# Patient Record
Sex: Male | Born: 1975 | Race: Black or African American | Hispanic: No | Marital: Married | Smoking: Former smoker
Health system: Southern US, Community
[De-identification: ages and names within clinical notes are randomized; demographics above are authoritative.]

## PROBLEM LIST (undated history)

## (undated) DIAGNOSIS — E119 Type 2 diabetes mellitus without complications: Secondary | ICD-10-CM

## (undated) DIAGNOSIS — K219 Gastro-esophageal reflux disease without esophagitis: Secondary | ICD-10-CM

## (undated) DIAGNOSIS — Z789 Other specified health status: Secondary | ICD-10-CM

## (undated) HISTORY — DX: Type 2 diabetes mellitus without complications: E11.9

## (undated) HISTORY — PX: NO PAST SURGERIES: SHX2092

---

## 2009-04-01 ENCOUNTER — Emergency Department (HOSPITAL_COMMUNITY): Admission: EM | Admit: 2009-04-01 | Discharge: 2009-04-01 | Payer: Self-pay | Admitting: Emergency Medicine

## 2011-10-27 ENCOUNTER — Other Ambulatory Visit: Payer: Self-pay

## 2011-10-27 ENCOUNTER — Encounter: Payer: Self-pay | Admitting: Family Medicine

## 2011-10-27 ENCOUNTER — Ambulatory Visit (INDEPENDENT_AMBULATORY_CARE_PROVIDER_SITE_OTHER): Payer: BC Managed Care – PPO | Admitting: Family Medicine

## 2011-10-27 ENCOUNTER — Encounter (HOSPITAL_COMMUNITY): Payer: Self-pay

## 2011-10-27 ENCOUNTER — Inpatient Hospital Stay (HOSPITAL_COMMUNITY)
Admission: EM | Admit: 2011-10-27 | Discharge: 2011-10-30 | DRG: 566 | Disposition: A | Discharge status: Home / Self Care | Payer: BC Managed Care – PPO | Attending: Internal Medicine | Admitting: Internal Medicine

## 2011-10-27 VITALS — BP 130/88 | HR 125 | Temp 97.5°F | Resp 22

## 2011-10-27 DIAGNOSIS — E1101 Type 2 diabetes mellitus with hyperosmolarity with coma: Secondary | ICD-10-CM

## 2011-10-27 DIAGNOSIS — K219 Gastro-esophageal reflux disease without esophagitis: Secondary | ICD-10-CM | POA: Diagnosis present

## 2011-10-27 DIAGNOSIS — E860 Dehydration: Secondary | ICD-10-CM | POA: Diagnosis present

## 2011-10-27 DIAGNOSIS — E870 Hyperosmolality and hypernatremia: Secondary | ICD-10-CM | POA: Diagnosis present

## 2011-10-27 DIAGNOSIS — R3581 Nocturnal polyuria: Secondary | ICD-10-CM

## 2011-10-27 DIAGNOSIS — E1100 Type 2 diabetes mellitus with hyperosmolarity without nonketotic hyperglycemic-hyperosmolar coma (NKHHC): Secondary | ICD-10-CM

## 2011-10-27 DIAGNOSIS — E875 Hyperkalemia: Secondary | ICD-10-CM | POA: Diagnosis present

## 2011-10-27 DIAGNOSIS — R351 Nocturia: Secondary | ICD-10-CM

## 2011-10-27 DIAGNOSIS — R5383 Other fatigue: Secondary | ICD-10-CM

## 2011-10-27 DIAGNOSIS — E118 Type 2 diabetes mellitus with unspecified complications: Secondary | ICD-10-CM | POA: Insufficient documentation

## 2011-10-27 DIAGNOSIS — N179 Acute kidney failure, unspecified: Secondary | ICD-10-CM | POA: Diagnosis present

## 2011-10-27 HISTORY — DX: Gastro-esophageal reflux disease without esophagitis: K21.9

## 2011-10-27 HISTORY — DX: Other specified health status: Z78.9

## 2011-10-27 LAB — GLUCOSE, CAPILLARY
Glucose-Capillary: 481 mg/dL — ABNORMAL HIGH (ref 70–99)
Glucose-Capillary: 434 mg/dL — ABNORMAL HIGH (ref 70–99)
Glucose-Capillary: 420 mg/dL — ABNORMAL HIGH (ref 70–99)
Glucose-Capillary: 304 mg/dL — ABNORMAL HIGH (ref 70–99)

## 2011-10-27 LAB — BLOOD GAS, VENOUS
Acid-base deficit: 1.7 mmol/L (ref 0.0–2.0)
Bicarbonate: 25.3 mEq/L — ABNORMAL HIGH (ref 20.0–24.0)
O2 Saturation: 97 %
Patient temperature: 98.6
TCO2: 21.3 mmol/L (ref 0–100)
pCO2, Ven: 52 mmHg — ABNORMAL HIGH (ref 45.0–50.0)
pH, Ven: 7.309 — ABNORMAL HIGH (ref 7.250–7.300)
pO2, Ven: 107 mmHg — ABNORMAL HIGH (ref 30.0–45.0)

## 2011-10-27 LAB — OSMOLALITY: Osmolality: 387 mOsm/kg — ABNORMAL HIGH (ref 275–300)

## 2011-10-27 LAB — POCT CBC
Granulocyte percent: 81.4 %G — AB (ref 37–80)
HCT, POC: 53.9 % — AB (ref 43.5–53.7)
Hemoglobin: 18.1 g/dL (ref 14.1–18.1)
Lymph, poc: 1.6 (ref 0.6–3.4)
MCH, POC: 29.7 pg (ref 27–31.2)
MCHC: 33.5 g/dL (ref 31.8–35.4)
MCV: 88.5 fL (ref 80–97)
MID (cbc): 0.7 (ref 0–0.9)
MPV: 15 fL (ref 0–99.8)
POC Granulocyte: 9.8 — AB (ref 2–6.9)
POC LYMPH PERCENT: 13.1 %L (ref 10–50)
POC MID %: 5.5 %M (ref 0–12)
Platelet Count, POC: 235 10*3/uL (ref 142–424)
RBC: 6.09 M/uL (ref 4.69–6.13)
RDW, POC: 14.6 %
WBC: 12 10*3/uL — AB (ref 4.6–10.2)

## 2011-10-27 LAB — HEPATIC FUNCTION PANEL
ALT: 42 U/L (ref 0–53)
Albumin: 4.1 g/dL (ref 3.5–5.2)
Alkaline Phosphatase: 93 U/L (ref 39–117)
Total Protein: 8.2 g/dL (ref 6.0–8.3)

## 2011-10-27 LAB — URINE MICROSCOPIC-ADD ON

## 2011-10-27 LAB — DIFFERENTIAL
Eosinophils Absolute: 0 10*3/uL (ref 0.0–0.7)
Lymphocytes Relative: 10 % — ABNORMAL LOW (ref 12–46)
Lymphs Abs: 1.3 10*3/uL (ref 0.7–4.0)
Monocytes Absolute: 0.9 10*3/uL (ref 0.1–1.0)
Neutrophils Relative %: 82 % — ABNORMAL HIGH (ref 43–77)

## 2011-10-27 LAB — TROPONIN I: Troponin I: <0.3 ng/mL (ref <0.30)

## 2011-10-27 LAB — URINALYSIS, ROUTINE W REFLEX MICROSCOPIC
Bilirubin Urine: NEGATIVE
Nitrite: NEGATIVE
Specific Gravity, Urine: 1.038 — ABNORMAL HIGH (ref 1.005–1.030)
pH: 5 (ref 5.0–8.0)

## 2011-10-27 LAB — BASIC METABOLIC PANEL
BUN: 54 mg/dL — ABNORMAL HIGH (ref 6–23)
CO2: 23 mEq/L (ref 19–32)
CO2: 24 mEq/L (ref 19–32)
Calcium: 12.1 mg/dL — ABNORMAL HIGH (ref 8.4–10.5)
Calcium: 9.8 mg/dL (ref 8.4–10.5)
Chloride: 101 mEq/L (ref 96–112)
Chloride: 118 mEq/L — ABNORMAL HIGH (ref 96–112)
Creatinine, Ser: 2.04 mg/dL — ABNORMAL HIGH (ref 0.50–1.35)
GFR calc Af Amer: 47 mL/min — ABNORMAL LOW (ref >90)
GFR calc non Af Amer: 41 mL/min — ABNORMAL LOW (ref >90)
Glucose, Bld: 1006 mg/dL (ref 70–99)
Potassium: 5.3 mEq/L — ABNORMAL HIGH (ref 3.5–5.1)
Sodium: 149 mEq/L — ABNORMAL HIGH (ref 135–145)
Sodium: 160 mEq/L — ABNORMAL HIGH (ref 135–145)

## 2011-10-27 LAB — POCT GLYCOSYLATED HEMOGLOBIN (HGB A1C): Hemoglobin A1C: 12.3

## 2011-10-27 LAB — GLUCOSE, POCT (MANUAL RESULT ENTRY)

## 2011-10-27 MED ORDER — ONDANSETRON HCL 4 MG PO TABS: 4.0000 mg | ORAL_TABLET | Freq: Four times a day (QID) | ORAL | Status: DC | PRN

## 2011-10-27 MED ORDER — SODIUM CHLORIDE 0.45 % IV SOLN: INTRAVENOUS | Status: DC

## 2011-10-27 MED ORDER — LIVING WELL WITH DIABETES BOOK
Freq: Once | Status: DC
  Filled 2011-10-27 (×2): qty 1

## 2011-10-27 MED ORDER — DEXTROSE-NACL 5-0.45 % IV SOLN: INTRAVENOUS | Status: DC

## 2011-10-27 MED ORDER — SODIUM CHLORIDE 0.9 % IV SOLN
INTRAVENOUS | Status: DC
  Administered 2011-10-27: 17:00:00 via INTRAVENOUS

## 2011-10-27 MED ORDER — INSULIN REGULAR BOLUS VIA INFUSION
0.0000 [IU] | Freq: Three times a day (TID) | INTRAVENOUS | Status: DC
  Filled 2011-10-27: qty 10

## 2011-10-27 MED ORDER — DEXTROSE 50 % IV SOLN: 25.0000 mL | INTRAVENOUS | Status: DC | PRN

## 2011-10-27 MED ORDER — POLYETHYLENE GLYCOL 3350 17 G PO PACK
17.0000 g | PACK | Freq: Every day | ORAL | Status: DC | PRN
  Filled 2011-10-27: qty 1

## 2011-10-27 MED ORDER — PANTOPRAZOLE SODIUM 40 MG PO TBEC
40.0000 mg | DELAYED_RELEASE_TABLET | Freq: Every day | ORAL | Status: DC
  Administered 2011-10-28 – 2011-10-30 (×3): 40 mg via ORAL
  Filled 2011-10-27 (×3): qty 1

## 2011-10-27 MED ORDER — ACETAMINOPHEN 325 MG PO TABS: 650.0000 mg | ORAL_TABLET | Freq: Four times a day (QID) | ORAL | Status: DC | PRN

## 2011-10-27 MED ORDER — ACETAMINOPHEN 650 MG RE SUPP: 650.0000 mg | Freq: Four times a day (QID) | RECTAL | Status: DC | PRN

## 2011-10-27 MED ORDER — INSULIN ASPART 100 UNIT/ML ~~LOC~~ SOLN
10.0000 [IU] | Freq: Once | SUBCUTANEOUS | Status: AC
  Administered 2011-10-27: 10 [IU] via INTRAVENOUS
  Filled 2011-10-27: qty 1

## 2011-10-27 MED ORDER — OXYCODONE HCL 5 MG PO TABS: 5.0000 mg | ORAL_TABLET | ORAL | Status: DC | PRN

## 2011-10-27 MED ORDER — DEXTROSE-NACL 5-0.45 % IV SOLN
INTRAVENOUS | Status: DC
  Administered 2011-10-28: 75 mL/h via INTRAVENOUS

## 2011-10-27 MED ORDER — MORPHINE SULFATE 2 MG/ML IJ SOLN: 1.0000 mg | INTRAMUSCULAR | Status: DC | PRN

## 2011-10-27 MED ORDER — SODIUM CHLORIDE 0.9 % IV BOLUS (SEPSIS)
1000.0000 mL | Freq: Once | INTRAVENOUS | Status: AC
  Administered 2011-10-27: 1000 mL via INTRAVENOUS

## 2011-10-27 MED ORDER — SODIUM CHLORIDE 0.45 % IV SOLN
INTRAVENOUS | Status: DC
  Administered 2011-10-27 – 2011-10-28 (×2): via INTRAVENOUS

## 2011-10-27 MED ORDER — SODIUM CHLORIDE 0.9 % IV SOLN
INTRAVENOUS | Status: DC
  Filled 2011-10-27: qty 1

## 2011-10-27 MED ORDER — SODIUM CHLORIDE 0.9 % IJ SOLN
3.0000 mL | Freq: Two times a day (BID) | INTRAMUSCULAR | Status: DC
  Administered 2011-10-27 – 2011-10-29 (×3): 3 mL via INTRAVENOUS

## 2011-10-27 MED ORDER — SODIUM CHLORIDE 0.9 % IV SOLN
INTRAVENOUS | Status: DC
  Administered 2011-10-28: 4.3 [IU]/h via INTRAVENOUS
  Filled 2011-10-27 (×2): qty 1

## 2011-10-27 MED ORDER — ENOXAPARIN SODIUM 40 MG/0.4ML ~~LOC~~ SOLN
40.0000 mg | SUBCUTANEOUS | Status: DC
  Administered 2011-10-27 – 2011-10-29 (×3): 40 mg via SUBCUTANEOUS
  Filled 2011-10-27 (×4): qty 0.4

## 2011-10-27 MED ORDER — SODIUM CHLORIDE 0.9 % IV SOLN: INTRAVENOUS | Status: DC

## 2011-10-27 MED ORDER — INSULIN REGULAR BOLUS VIA INFUSION: 0.0000 [IU] | Freq: Three times a day (TID) | INTRAVENOUS | Status: DC

## 2011-10-27 MED ORDER — ONDANSETRON HCL 4 MG/2ML IJ SOLN: 4.0000 mg | Freq: Four times a day (QID) | INTRAMUSCULAR | Status: DC | PRN

## 2011-10-27 NOTE — Progress Notes (Signed)
Inpatient Diabetes Program Recommendations  AACE/ADA: New Consensus Statement on Inpatient Glycemic Control (2009)  Target Ranges:  Prepandial:   less than 140 mg/dL      Peak postprandial:   less than 180 mg/dL (1-2 hours)      Critically ill patients:  140 - 180 mg/dL    Inpatient Diabetes Program Recommendations Insulin - IV drip/GlucoStabilizer: gtt until stable  HgbA1C: 12.3 Outpatient Referral: will need OP referral for education  Orders entered for bedside RN to begin basic DM education once patient is feeling better.  Ordered 'Living Well with Diabetes' patient education manual for RN to go over with patient.  RN to assist patient with DM videos 501-510 on patient education network.

## 2011-10-27 NOTE — ED Provider Notes (Signed)
Medical screening examination/treatment/procedure(s) were conducted as a shared visit with non-physician practitioner(s) and myself.  I personally evaluated the patient during the encounter.  35yM referred from urgent care for evaluation of hyperglycemia. Pt reports about 2w of general malaise, polyuria and polydipsia. New onset diabetes. No fever or chills. No cough or SOB. No v/d. No urinary complaints. Labs with hyperglycemic state, but no evidence of acidosis. Non focal neurological exam and no report of confusion. Dehydrated. Hypernatremic. ARF.  Plan insulin, IVF. Pt will require admission for further evaluation and treatment.  CRITICAL CARE Performed by: Raeford Razor   Total critical care time: 35 minutes.  Critical care time was exclusive of separately billable procedures and treating other patients.  Critical care was necessary to treat or prevent imminent or life-threatening deterioration.  Critical care was time spent personally by me on the following activities: development of treatment plan with patient and/or surrogate as well as nursing, discussions with consultants, evaluation of patient's response to treatment, examination of patient, obtaining history from patient or surrogate, ordering and performing treatments and interventions, ordering and review of laboratory studies, ordering and review of radiographic studies, pulse oximetry and re-evaluation of patient's condition.  Raeford Razor, MD 11/03/11 [phone]

## 2011-10-27 NOTE — ED Provider Notes (Signed)
History     CSN: [id number]  Arrival date & time 10/27/11  1043   First MD Initiated Contact with Patient 10/27/11 1104      Chief Complaint  Patient presents with  . Hyperglycemia  . new onset diabetes     (Consider location/radiation/quality/duration/timing/severity/associated sxs/prior treatment) HPI  36 year old male presenting to the ED from [facility] for new onset of hyperglycemia, with associated fatigue, polyurea and polydipsia for the past several weeks.    Patient states for the past 2 weeks he has been feeling more tired than usual. He noticed that he has decreased appetite, with increased thirst and increased urination. He denies any precipitating factor. Denies increased recently. She denies fever, headache, sore throat, chest pain, shortness of breath, abdominal pain, or burning urination. He does have family history of diabetes. He has never been diagnosed with diabetes. He denies of the chronic medical problems.  No past medical history on file.  No past surgical history on file.  No family history on file.  History  Substance Use Topics  . Smoking status: Former Smoker    Types: Cigarettes  . Smokeless tobacco: Not on file  . Alcohol Use: Not on file      Review of Systems  All other systems reviewed and are negative.    Allergies  Review of patient's allergies indicates not on file.  Home Medications  No current outpatient prescriptions on file.  BP 110/82  Pulse 119  Temp 97.7 F (36.5 C)  Resp 20  SpO2 100%  Physical Exam  Nursing note and vitals reviewed. Constitutional: He appears well-developed and well-nourished.       Mildly lethargic but nontoxic appearance  HENT:  Head: Atraumatic.       Oral mucosa dry.  Eyes: Conjunctivae and EOM are normal. Pupils are equal, round, and reactive to light. Right eye exhibits no discharge. Left eye exhibits no discharge.  Neck: Neck supple.  Cardiovascular:       Tachycardia with  normal rhythm. No murmur, rubs or gallops  Pulmonary/Chest: Effort normal. No respiratory distress. He has no wheezes. He has no rales. He exhibits no tenderness.  Abdominal: Soft. There is no tenderness. There is no rebound.  Musculoskeletal: He exhibits no tenderness.       Baseline ROM, no obvious new focal weakness  Neurological:       Mental status and motor strength appears baseline for patient and situation  Skin: Skin is warm and dry. No rash noted.  Psychiatric: He has a normal mood and affect.    ED Course  Procedures (including critical care time)  Labs Reviewed - No data to display No results found.   No diagnosis found.  Results for orders placed in visit on 10/27/11  POCT CBC      Component Value Range   WBC 12.0 (*) 4.6 - 10.2 (K/uL)   Lymph, poc 1.6  0.6 - 3.4    POC LYMPH PERCENT 13.1  10 - 50 (%L)   MID (cbc) 0.7  0 - 0.9    POC MID % 5.5  0 - 12 (%M)   POC Granulocyte 9.8 (*) 2 - 6.9    Granulocyte percent 81.4 (*) 37 - 80 (%G)   RBC 6.09  4.69 - 6.13 (M/uL)   Hemoglobin 18.1  14.1 - 18.1 (g/dL)   HCT, POC 56.3 (*) 87.5 - 53.7 (%)   MCV 88.5  80 - 97 (fL)   MCH, POC 29.7  27 -  31.2 (pg)   MCHC 33.5  31.8 - 35.4 (g/dL)   RDW, POC 16.1     Platelet Count, POC 235  142 - 424 (K/uL)   MPV 15.0  0 - 99.8 (fL)  GLUCOSE, POCT (MANUAL RESULT ENTRY)      Component Value Range   POC Glucose HHH over 444    POCT GLYCOSYLATED HEMOGLOBIN (HGB A1C)      Component Value Range   Hemoglobin A1C 12.3     No results found.  Results for orders placed during the hospital encounter of 10/27/11  BASIC METABOLIC PANEL      Component Value Range   Sodium 149 (*) 135 - 145 (mEq/L)   Potassium 5.3 (*) 3.5 - 5.1 (mEq/L)   Chloride 101  96 - 112 (mEq/L)   CO2 23  19 - 32 (mEq/L)   Glucose, Bld 1006 (*) 70 - 99 (mg/dL)   BUN 54 (*) 6 - 23 (mg/dL)   Creatinine, Ser 0.96 (*) 0.50 - 1.35 (mg/dL)   Calcium 04.5 (*) 8.4 - 10.5 (mg/dL)   GFR calc non Af Amer 41 (*) >90  (mL/min)   GFR calc Af Amer 47 (*) >90 (mL/min)  DIFFERENTIAL      Component Value Range   Neutro Abs PENDING  1.7 - 7.7 (K/uL)   Lymphs Abs PENDING  0.7 - 4.0 (K/uL)   Monocytes Absolute PENDING  0.1 - 1.0 (K/uL)   Eosinophils Absolute PENDING  0.0 - 0.7 (K/uL)   Basophils Absolute PENDING  0.0 - 0.1 (K/uL)   Neutrophils Relative 82 (*) 43 - 77 (%)   Lymphocytes Relative 10 (*) 12 - 46 (%)   Monocytes Relative 8  3 - 12 (%)   Eosinophils Relative 0  0 - 5 (%)   Basophils Relative 0  0 - 1 (%)   Smear Review MORPHOLOGY UNREMARKABLE    URINALYSIS, ROUTINE W REFLEX MICROSCOPIC      Component Value Range   Color, Urine YELLOW  YELLOW    APPearance CLEAR  CLEAR    Specific Gravity, Urine 1.038 (*) 1.005 - 1.030    pH 5.0  5.0 - 8.0    Glucose, UA >1000 (*) NEGATIVE (mg/dL)   Hgb urine dipstick LARGE (*) NEGATIVE    Bilirubin Urine NEGATIVE  NEGATIVE    Ketones, ur TRACE (*) NEGATIVE (mg/dL)   Protein, ur 30 (*) NEGATIVE (mg/dL)   Urobilinogen, UA 0.2  0.0 - 1.0 (mg/dL)   Nitrite NEGATIVE  NEGATIVE    Leukocytes, UA NEGATIVE  NEGATIVE   TROPONIN I      Component Value Range   Troponin I <0.30  <0.30 (ng/mL)  BLOOD GAS, VENOUS      Component Value Range   pH, Ven 7.309 (*) 7.250 - 7.300    pCO2, Ven 52.0 (*) 45.0 - 50.0 (mmHg)   pO2, Ven 107.0 (*) 30.0 - 45.0 (mmHg)   Bicarbonate 25.3 (*) 20.0 - 24.0 (mEq/L)   TCO2 21.3  0 - 100 (mmol/L)   Acid-base deficit 1.7  0.0 - 2.0 (mmol/L)   O2 Saturation 97.0     Patient temperature 98.6     Drawn by CB,RN     Sample type VENOUS    URINE MICROSCOPIC-ADD ON      Component Value Range   Squamous Epithelial / LPF RARE  RARE    WBC, UA 0-2  <3 (WBC/hpf)   RBC / HPF 3-6  <3 (RBC/hpf)   Bacteria, UA RARE  RARE    Casts HYALINE CASTS (*) NEGATIVE    No results found.     Date: 10/27/2011  Rate: 121  Rhythm: sinus tachycardia  QRS Axis: normal  Intervals: normal  ST/T Wave abnormalities: nonspecific ST changes  Conduction  Disutrbances:none  Narrative Interpretation:   Old EKG Reviewed: none available    MDM  Patient presents with symptoms suggestive of diabetes. His hemoglobin A1c of 12.3 and a CBG of greater than 400s prior to arrival. No obvious precipitating factor, but I anticipate that this has been undiagnosed chronic problem as reflected by his A1C  12:22 PM Patient's labs reflect evidence of dehydration and appears to be consistence with HONK hyperglycemia.  Will call for admission.   No prior hx of diabetes, presenting with 2 weeks of increase fatigue, polyurea, polydipsia.  No fever.  A1C 12.3.  CBG >1000.  Non acidosis. WBC 12, BUN 54, Cr 2.04.  Need admission for further evaluation and diabetic education.  Currently receiving IVF, will start glucose stabilizer unless recommend otherwise.    1:23 PM I have discussed with [facility] hospitalist, Dr. Gwenlyn Perking, who has agrees to admit the patient. Patient will be admitted to a telemetry bed, team 4, under Dr. Gwenlyn Perking.  He also requests for patient to be started on a glucose stabilizer and normal saline running at 150 cc per hour.  Fayrene Helper, PA-C 10/27/11 1324

## 2011-10-27 NOTE — ED Notes (Signed)
Dr. Gwenlyn Perking has come to see the pt and states he will be making adjustments to the admitting orders.  He states the pt is ok to go to the bed he has been assigned to as his CBG at 1300 was 481.

## 2011-10-27 NOTE — Progress Notes (Deleted)
CRITICAL VALUE ALERT  Critical value received:  Serum osmalality 387  Date of notification:  10/27/11   Time of notification:  1806  Critical value read back:yes  Nurse who received alert:  CCMyers  MD notified (1st page):  [place]  Time of first page:  1810  MD notified (2nd page):  Time of second page:  Responding MD:  Gwenlyn Perking  Time MD responded:  [phone]

## 2011-10-27 NOTE — Progress Notes (Signed)
IV placed R AC 20 gauge NACL

## 2011-10-27 NOTE — Progress Notes (Signed)
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  His mouth is dry and he feels like he is about to pass out.  He is brought back emergently as he could not even sign his name when he came in with his mother.  Mom states pt never has had any medical problems.  No surgery.  No allergies.  No known injuries or headache.  No fever, abd or chest pain.    O:  Pt somnolent, hyperventilating and barely responsive HEENT unremarkable except for dry mucous membranes Chest clear Heart rapid, regular Abdomen:  Soft, nontender without masses or HSM Extrem:  Unremarkable Neuro:  Barely responsive to voice, somnolent  Moving 4 extrem equally.  BS > 444  A:  Hyperosmolar, perhaps with DKA  P:  Stat IV and transport to ED

## 2011-10-27 NOTE — ED Notes (Signed)
[facility] hospitalist was called about admitting orders.  He will be coming to see the pt prior to the pt going upstairs.

## 2011-10-27 NOTE — ED Notes (Signed)
TO ED via [facility] from [facility] with new onset diabetes, hyperglycemia. Hx of two days of extreme fatigue and thirst. 2 weeks of nocturia. No pertinent medical hx.

## 2011-10-27 NOTE — H&P (Signed)
PCP:   No primary provider on file.   Chief Complaint:  Polyuria, polydipsia, generalized fatigue  HPI: 36 year old male without significant past medical history; came from [place] origin care for new onset of hyperglycemia, with associated generalized fatigue, polyuria and polydipsia for the past several weeks. She reports that for the last 2 weeks he has been feeling more tired than usual, and oriented he has decreased appetite, with increased thirst and increased urination. Patient denies any dysuria, chest pain, fever, chills, headache, sore throat or shortness of breath. Patient reports having some nausea/vomiting over the last 2 days.  Of note patient has history of first degree relative with diabetes, but denies ever being diagnosed with diabetes in the pastor having any medical problems.  Allergies:  No Known Allergies    Past Medical History  Diagnosis Date  . No pertinent past medical history   . GERD (gastroesophageal reflux disease)     Past Surgical History  Procedure Date  . No past surgeries     Prior to Admission medications   Medication Sig Start Date End Date Taking? Authorizing Provider  Diphenhydramine-APAP (TYLENOL COLD RELIEF NIGHTTIME) 25-500 MG/15ML LIQD Take 10 mLs by mouth every 6 (six) hours as needed. For cold symptoms.   Yes Historical Provider, MD  ranitidine (ZANTAC) 75 MG tablet Take 75 mg by mouth daily as needed. For heartburn.   Yes Historical Provider, MD    Social History:  reports that he quit smoking about 5 years ago. His smoking use included Cigarettes. He has never used smokeless tobacco. He reports that he does not drink alcohol or use illicit drugs.  Family History  Problem Relation Age of Onset  . Diabetes type II Mother   . Hypertension Mother     Review of Systems:  Negative otherwise except as mentioned on history of present illness.   Physical Exam: Blood pressure 109/68, pulse 111, temperature 97.6 F (36.4 C),  temperature source Oral, resp. rate 18, SpO2 96.00%. Constitutional: He appears well-developed and well-nourished. In NAD  HENT:  Head: Atraumatic. Oral mucosa dry. Eyes: Conjunctivae and EOM are normal. Pupils are equal, round, and reactive to light. Right eye exhibits no discharge. Left eye exhibits no discharge.  Neck: Neck supple.  Cardiovascular: Tachycardia with normal rhythm. No murmur, rubs or gallops  Pulmonary/Chest: Effort normal. No respiratory distress. He has no wheezes. He has no rales. He exhibits no tenderness.  Abdominal: Soft. There is no tenderness. There is no rebound.  Musculoskeletal: He exhibits no tenderness. Normal ROM, no obvious new focal weakness  Neurological:AAOX; cranial nerve 2-12 grossly intact, muscle strength 5 out of 5 bilaterally symmetrically, normal finger to nose and no focal neurologic deficit.  Skin: Skin is warm and dry. No rash noted.  Psychiatric: He has a normal mood and affect.   Labs on Admission:  Results for orders placed during the hospital encounter of 10/27/11 (from the past 48 hour(s))  BASIC METABOLIC PANEL     Status: Abnormal   Collection Time   10/27/11 11:00 AM      Component Value Range Comment   Sodium 149 (*) 135 - 145 (mEq/L) REPEATED TO VERIFY   Potassium 5.3 (*) 3.5 - 5.1 (mEq/L) SLIGHT HEMOLYSIS   Chloride 101  96 - 112 (mEq/L) REPEATED TO VERIFY   CO2 23  19 - 32 (mEq/L) REPEATED TO VERIFY   Glucose, Bld 1006 (*) 70 - 99 (mg/dL)    BUN 54 (*) 6 - 23 (mg/dL)  Creatinine, Ser 2.04 (*) 0.50 - 1.35 (mg/dL)    Calcium 16.1 (*) 8.4 - 10.5 (mg/dL)    GFR calc non Af Amer 41 (*) >90 (mL/min)    GFR calc Af Amer 47 (*) >90 (mL/min)   DIFFERENTIAL     Status: Abnormal   Collection Time   10/27/11 11:00 AM      Component Value Range Comment   Neutro Abs 10.3 (*) 1.7 - 7.7 (K/uL)    Lymphs Abs 1.3  0.7 - 4.0 (K/uL)    Monocytes Absolute 0.9  0.1 - 1.0 (K/uL)    Eosinophils Absolute 0.0  0.0 - 0.7 (K/uL)    Basophils Absolute  0.0  0.0 - 0.1 (K/uL)    Neutrophils Relative 82 (*) 43 - 77 (%)    Lymphocytes Relative 10 (*) 12 - 46 (%)    Monocytes Relative 8  3 - 12 (%)    Eosinophils Relative 0  0 - 5 (%)    Basophils Relative 0  0 - 1 (%)    Smear Review MORPHOLOGY UNREMARKABLE     URINALYSIS, ROUTINE W REFLEX MICROSCOPIC     Status: Abnormal   Collection Time   10/27/11 11:36 AM      Component Value Range Comment   Color, Urine YELLOW  YELLOW     APPearance CLEAR  CLEAR     Specific Gravity, Urine 1.038 (*) 1.005 - 1.030     pH 5.0  5.0 - 8.0     Glucose, UA >1000 (*) NEGATIVE (mg/dL)    Hgb urine dipstick LARGE (*) NEGATIVE     Bilirubin Urine NEGATIVE  NEGATIVE     Ketones, ur TRACE (*) NEGATIVE (mg/dL)    Protein, ur 30 (*) NEGATIVE (mg/dL)    Urobilinogen, UA 0.2  0.0 - 1.0 (mg/dL)    Nitrite NEGATIVE  NEGATIVE     Leukocytes, UA NEGATIVE  NEGATIVE    URINE MICROSCOPIC-ADD ON     Status: Abnormal   Collection Time   10/27/11 11:36 AM      Component Value Range Comment   Squamous Epithelial / LPF RARE  RARE     WBC, UA 0-2  <3 (WBC/hpf)    RBC / HPF 3-6  <3 (RBC/hpf)    Bacteria, UA RARE  RARE     Casts HYALINE CASTS (*) NEGATIVE    BLOOD GAS, VENOUS     Status: Abnormal   Collection Time   10/27/11 11:50 AM      Component Value Range Comment   pH, Ven 7.309 (*) 7.250 - 7.300     pCO2, Ven 52.0 (*) 45.0 - 50.0 (mmHg)    pO2, Ven 107.0 (*) 30.0 - 45.0 (mmHg)    Bicarbonate 25.3 (*) 20.0 - 24.0 (mEq/L)    TCO2 21.3  0 - 100 (mmol/L)    Acid-base deficit 1.7  0.0 - 2.0 (mmol/L)    O2 Saturation 97.0      Patient temperature 98.6      Drawn by CB,RN      Sample type VENOUS     TROPONIN I     Status: Normal   Collection Time   10/27/11 12:30 PM      Component Value Range Comment   Troponin I <0.30  <0.30 (ng/mL)   GLUCOSE, CAPILLARY     Status: Abnormal   Collection Time   10/27/11  3:04 PM      Component Value Range Comment   Glucose-Capillary 481 (*)  70 - 99 (mg/dL)     Radiological Exams  on Admission: No results found.   Assessment/Plan 1-Diabetes mellitus with hyperosmolarity: Newly diagnosed diabetes with hyperosmolar state. Hemoglobin A1c 12.1. Will start the patient on glucose nebulizer, IV fluids, provide diabetes education and 1 stable determine will be his new regimen. Patient placed on clear liquid diet with plans of changing to modify carbohydrates.  2-Acute renal failure: Secondary to problem #1. We'll provide IV fluids and follow creatinine trend.  3-Hyperkalemia: Most likely has a sitter with hyperosmolar state; will provide fluid resuscitation and IV insulin. Patient has been admitted to telemetry.  4-Hypernatremia: secondary to dehydration most likely, will provide IV fluids and follow electrolytes trend.  5-Dehydration: Will provide IV fluids. Allow for clear liquids.   6-GERD (gastroesophageal reflux disease): Stop Protonix.  7-DVT: Lovenox.    Time Spent on Admission: 50 minutes  Sheryl Towell [facility] [phone]  10/27/2011, 4:21 PM

## 2011-10-27 NOTE — Progress Notes (Signed)
CRITICAL VALUE ALERT  Critical value received: 387  Date of notification:  10/27/2011  Time of notification:  1806  Critical value read back:yes  Nurse who received alert:  CCMyers  MD notified (1st page):  [place]  Time of first page:  1819  MD notified (2nd page):NA  Time of second page:  Responding MD:  Gwenlyn Perking  Time MD responded:  [phone]

## 2011-10-27 NOTE — ED Notes (Signed)
Admitting MD at bedside.

## 2011-10-27 NOTE — ED Notes (Signed)
ZOX:[id number]<AV> Expected date:<BR> Expected time:10:35 AM<BR> Means of arrival:<BR> Comments:<BR> M12 - 35yoM New onset diabetes, CBG HI

## 2011-10-28 LAB — GLUCOSE, CAPILLARY
Glucose-Capillary: 129 mg/dL — ABNORMAL HIGH (ref 70–99)
Glucose-Capillary: 157 mg/dL — ABNORMAL HIGH (ref 70–99)
Glucose-Capillary: 162 mg/dL — ABNORMAL HIGH (ref 70–99)
Glucose-Capillary: 167 mg/dL — ABNORMAL HIGH (ref 70–99)
Glucose-Capillary: 216 mg/dL — ABNORMAL HIGH (ref 70–99)
Glucose-Capillary: 227 mg/dL — ABNORMAL HIGH (ref 70–99)
Glucose-Capillary: 313 mg/dL — ABNORMAL HIGH (ref 70–99)
Glucose-Capillary: 329 mg/dL — ABNORMAL HIGH (ref 70–99)

## 2011-10-28 LAB — CBC
HCT: 50.6 % (ref 39.0–52.0)
Hemoglobin: 17.4 g/dL — ABNORMAL HIGH (ref 13.0–17.0)
MCV: 87.7 fL (ref 78.0–100.0)
RDW: 12.9 % (ref 11.5–15.5)
WBC: 10.9 10*3/uL — ABNORMAL HIGH (ref 4.0–10.5)

## 2011-10-28 LAB — BASIC METABOLIC PANEL
CO2: 26 mEq/L (ref 19–32)
Chloride: 123 mEq/L — ABNORMAL HIGH (ref 96–112)
Creatinine, Ser: 1.25 mg/dL (ref 0.50–1.35)
Potassium: 4.2 mEq/L (ref 3.5–5.1)

## 2011-10-28 LAB — TSH: TSH: 1.216 u[IU]/mL (ref 0.350–4.500)

## 2011-10-28 LAB — SODIUM, URINE, RANDOM: Sodium, Ur: 25 mEq/L

## 2011-10-28 MED ORDER — INSULIN ASPART 100 UNIT/ML ~~LOC~~ SOLN
0.0000 [IU] | SUBCUTANEOUS | Status: DC
  Administered 2011-10-28: 2 [IU] via SUBCUTANEOUS
  Administered 2011-10-28: 7 [IU] via SUBCUTANEOUS
  Filled 2011-10-28: qty 3

## 2011-10-28 MED ORDER — INSULIN GLARGINE 100 UNIT/ML ~~LOC~~ SOLN: 15.0000 [IU] | SUBCUTANEOUS | Status: DC

## 2011-10-28 MED ORDER — INSULIN GLARGINE 100 UNIT/ML ~~LOC~~ SOLN
15.0000 [IU] | Freq: Every day | SUBCUTANEOUS | Status: DC
  Administered 2011-10-28 – 2011-10-29 (×2): 15 [IU] via SUBCUTANEOUS

## 2011-10-28 MED ORDER — INSULIN GLARGINE 100 UNIT/ML ~~LOC~~ SOLN: 15.0000 [IU] | Freq: Every day | SUBCUTANEOUS | Status: DC

## 2011-10-28 MED ORDER — INSULIN ASPART 100 UNIT/ML ~~LOC~~ SOLN
0.0000 [IU] | Freq: Every day | SUBCUTANEOUS | Status: DC
  Administered 2011-10-28: 4 [IU] via SUBCUTANEOUS
  Administered 2011-10-29: 2 [IU] via SUBCUTANEOUS

## 2011-10-28 MED ORDER — INSULIN GLARGINE 100 UNIT/ML ~~LOC~~ SOLN
10.0000 [IU] | Freq: Every day | SUBCUTANEOUS | Status: DC
  Administered 2011-10-28: 10 [IU] via SUBCUTANEOUS
  Filled 2011-10-28: qty 3

## 2011-10-28 MED ORDER — INSULIN ASPART 100 UNIT/ML ~~LOC~~ SOLN
0.0000 [IU] | Freq: Three times a day (TID) | SUBCUTANEOUS | Status: DC
  Administered 2011-10-28: 5 [IU] via SUBCUTANEOUS
  Administered 2011-10-29 (×2): 8 [IU] via SUBCUTANEOUS
  Administered 2011-10-29 – 2011-10-30 (×2): 5 [IU] via SUBCUTANEOUS
  Administered 2011-10-30: 8 [IU] via SUBCUTANEOUS

## 2011-10-28 NOTE — Progress Notes (Signed)
Subjective: Feeling a lot better, no chest pain, no shortness of breath. Reports he improvement in his appetite.  Objective: Vital signs in last 24 hours: Temp:  [97.4 F (36.3 C)-98.2 F (36.8 C)] 97.4 F (36.3 C) (02/02 1400) Pulse Rate:  [97-105] 97  (02/02 1400) Resp:  [18-20] 18  (02/02 1400) BP: (123-137)/(86-92) 134/86 mmHg (02/02 1400) SpO2:  [95 %-97 %] 97 % (02/02 1400) Weight change:  Last BM Date: 10/25/11  Intake/Output from previous day: 02/01 0701 - 02/02 0700 In: 1200.1 [I.V.:1200.1] Out: 550 [Urine:550]     Physical Exam: General: Alert, awake, oriented x3, in no acute distress. HEENT: No bruits, no goiter. Heart: Regular rate and rhythm, without murmurs, rubs, gallops. Lungs: Clear to auscultation bilaterally. Abdomen: Soft, nontender, nondistended, positive bowel sounds. Extremities: No clubbing cyanosis or edema with positive pedal pulses. Neuro: Grossly intact, nonfocal.  Lab Results: Basic Metabolic Panel:  Basename 10/28/11 0615 10/27/11 1700 10/27/11 1530  NA 160* -- 160*  K 4.2 -- 3.8  CL 123* -- 118*  CO2 26 -- 24  GLUCOSE 174* -- 487*  BUN 28* -- 43*  CREATININE 1.25 -- 1.61*  CALCIUM 9.7 -- 9.8  MG -- 3.3* --  PHOS -- 4.0 --   Liver Function Tests:  Basename 10/27/11 1700  AST 26  ALT 42  ALKPHOS 93  BILITOT 0.4  PROT 8.2  ALBUMIN 4.1   CBC:  Basename 10/28/11 0615 10/27/11 1100 10/27/11 1014  WBC 10.9* -- 12.0*  NEUTROABS -- 10.3* --  HGB 17.4* -- 18.1  HCT 50.6 -- 53.9*  MCV 87.7 -- 88.5  PLT 209 -- --   Cardiac Enzymes:  Basename 10/27/11 1230  CKTOTAL --  CKMB --  CKMBINDEX --  TROPONINI <0.30   CBG:  Basename 10/28/11 1354 10/28/11 1216 10/28/11 0817 10/28/11 0711 10/28/11 0623 10/28/11 0516  GLUCAP 313* 309* 188* 179* 167* 157*   Hemoglobin A1C:  Basename 10/27/11 1016  HGBA1C 12.3   Thyroid Function Tests:  Basename 10/27/11 1715  TSH 1.216  T4TOTAL --  FREET4 --  T3FREE --  THYROIDAB --    Urinalysis:  Basename 10/27/11 1136  COLORURINE YELLOW  LABSPEC 1.038*  PHURINE 5.0  GLUCOSEU >1000*  HGBUR LARGE*  BILIRUBINUR NEGATIVE  KETONESUR TRACE*  PROTEINUR 30*  UROBILINOGEN 0.2  NITRITE NEGATIVE  LEUKOCYTESUR NEGATIVE   Studies/Results: No results found.  Medications: Scheduled Meds:   . enoxaparin  40 mg Subcutaneous Q24H  . insulin aspart  0-15 Units Subcutaneous TID WC  . insulin aspart  0-5 Units Subcutaneous QHS  . insulin glargine  15 Units Subcutaneous QHS  . insulin glargine  15 Units Subcutaneous NOW  . living well with diabetes book   Does not apply Once  . pantoprazole  40 mg Oral Q1200  . sodium chloride  3 mL Intravenous Q12H  . DISCONTD: insulin aspart  0-9 Units Subcutaneous Q4H  . DISCONTD: insulin glargine  10 Units Subcutaneous QHS  . DISCONTD: insulin glargine  15 Units Subcutaneous QHS  . DISCONTD: insulin regular  0-10 Units Intravenous TID WC   Continuous Infusions:   . sodium chloride 100 mL/hr at 10/28/11 0828  . DISCONTD: sodium chloride    . DISCONTD: sodium chloride 150 mL/hr at 10/27/11 1726  . DISCONTD: dextrose 5 % and 0.45% NaCl 75 mL/hr at 10/28/11 0700  . DISCONTD: insulin (NOVOLIN-R) infusion 4.8 Units/hr (10/28/11 0722)   PRN Meds:.acetaminophen, acetaminophen, dextrose, morphine, ondansetron (ZOFRAN) IV, ondansetron, oxyCODONE, polyethylene glycol  Assessment/Plan:  1-Diabetes mellitus with hyperosmolarity: Hyperosmolar state resolve at this moment. Continue sliding scale insulin and also low and acting insulin to control patient's diabetes. Will continue IV fluids at 75 cc per hour and advance his diet to a modified carbohydrates.  2-Acute renal failure: Secondary to severe dehydration and volume contraction due to  hyperosmolality state, at this point resolve and back to within normal limits. Will continue monitoring kidney function.  3-Hyperkalemia: Resolved.  4-Hypernatremia: Stable and is slowly improving, will  continue saline 0.45%.  5-Dehydration: Resolved after IV fluid resuscitation.  6-GERD (gastroesophageal reflux disease): Continue PPI.  7-DVT: Continue Lovenox.    LOS: 1 day   Lexxie Winberg [facility] [phone]  10/28/2011, 4:46 PM

## 2011-10-29 LAB — GLUCOSE, CAPILLARY: Glucose-Capillary: 289 mg/dL — ABNORMAL HIGH (ref 70–99)

## 2011-10-29 LAB — CBC
HCT: 45.3 % (ref 39.0–52.0)
Hemoglobin: 15.4 g/dL (ref 13.0–17.0)
MCH: 30 pg (ref 26.0–34.0)
MCHC: 34 g/dL (ref 30.0–36.0)
MCV: 88.1 fL (ref 78.0–100.0)
Platelets: 161 10*3/uL (ref 150–400)
RBC: 5.14 MIL/uL (ref 4.22–5.81)
WBC: 7.5 10*3/uL (ref 4.0–10.5)

## 2011-10-29 LAB — BASIC METABOLIC PANEL
CO2: 23 mEq/L (ref 19–32)
Chloride: 108 mEq/L (ref 96–112)
GFR calc Af Amer: >90 mL/min (ref >90)
Potassium: 4.2 mEq/L (ref 3.5–5.1)
Sodium: 145 mEq/L (ref 135–145)

## 2011-10-29 MED ORDER — INSULIN PEN STARTER KIT
1.0000 | Freq: Once | Status: AC
  Administered 2011-10-29: 1
  Filled 2011-10-29: qty 1

## 2011-10-29 MED ORDER — METFORMIN HCL 500 MG PO TABS
500.0000 mg | ORAL_TABLET | Freq: Two times a day (BID) | ORAL | Status: DC
  Administered 2011-10-29 – 2011-10-30 (×2): 500 mg via ORAL
  Filled 2011-10-29 (×3): qty 1

## 2011-10-29 NOTE — Progress Notes (Signed)
Pt states that he does NOT have a PCP.  He will need an MD to monitor his diabetes and would like to have this set-up before he leaves the hospital.  Pt has been taught by writer the use of FlexPen for his Lantus when he goes home for HS use. Nutritionist spoke with pt today and explained the diabetic diet and handouts were given. FlexPen kit was given to pt and his spouse and instructions explained.

## 2011-10-29 NOTE — Progress Notes (Signed)
Subjective: Feeling a lot better, no chest pain, no shortness of breath. CBG's still in the 260-280 range.   Objective: Vital signs in last 24 hours: Temp:  [97.8 F (36.6 C)-98.2 F (36.8 C)] 98.2 F (36.8 C) (02/03 1328) Pulse Rate:  [74-99] 99  (02/03 1328) Resp:  [16-20] 18  (02/03 1328) BP: (130-143)/(79-89) 131/79 mmHg (02/03 1328) SpO2:  [97 %-99 %] 97 % (02/03 1328) Weight change:  Last BM Date: 10/28/11  Intake/Output from previous day: 02/02 0701 - 02/03 0700 In: 2067.1 [I.V.:2067.1] Out: 300 [Urine:300] Total I/O In: -  Out: 1000 [Urine:1000]   Physical Exam: General: Alert, awake, oriented x3, in no acute distress. HEENT: No bruits, no goiter. Heart: Regular rate and rhythm, without murmurs, rubs, gallops. Lungs: Clear to auscultation bilaterally. Abdomen: Soft, nontender, nondistended, positive bowel sounds. Extremities: No clubbing cyanosis or edema with positive pedal pulses. Neuro: Grossly intact, nonfocal.  Lab Results: Basic Metabolic Panel:  Basename 10/29/11 0522 10/28/11 0615 10/27/11 1700  NA 145 160* --  K 4.2 4.2 --  CL 108 123* --  CO2 23 26 --  GLUCOSE 271* 174* --  BUN 21 28* --  CREATININE 1.03 1.25 --  CALCIUM 8.8 9.7 --  MG -- -- 3.3*  PHOS -- -- 4.0   Liver Function Tests:  Basename 10/27/11 1700  AST 26  ALT 42  ALKPHOS 93  BILITOT 0.4  PROT 8.2  ALBUMIN 4.1   CBC:  Basename 10/29/11 0522 10/28/11 0615 10/27/11 1100  WBC 7.5 10.9* --  NEUTROABS -- -- 10.3*  HGB 15.4 17.4* --  HCT 45.3 50.6 --  MCV 88.1 87.7 --  PLT 161 209 --   Cardiac Enzymes:  Basename 10/27/11 1230  CKTOTAL --  CKMB --  CKMBINDEX --  TROPONINI <0.30   CBG:  Basename 10/29/11 1153 10/29/11 0744 10/28/11 2141 10/28/11 1634 10/28/11 1354 10/28/11 1216  GLUCAP 289* 257* 329* 227* 313* 309*   Hemoglobin A1C:  Basename 10/27/11 1016  HGBA1C 12.3   Thyroid Function Tests:  Basename 10/27/11 1715  TSH 1.216  T4TOTAL --  FREET4 --    T3FREE --  THYROIDAB --   Urinalysis:  Basename 10/27/11 1136  COLORURINE YELLOW  LABSPEC 1.038*  PHURINE 5.0  GLUCOSEU >1000*  HGBUR LARGE*  BILIRUBINUR NEGATIVE  KETONESUR TRACE*  PROTEINUR 30*  UROBILINOGEN 0.2  NITRITE NEGATIVE  LEUKOCYTESUR NEGATIVE   Studies/Results: No results found.  Medications: Scheduled Meds:    . enoxaparin  40 mg Subcutaneous Q24H  . Flexpen Starter Kit  1 kit Other Once  . insulin aspart  0-15 Units Subcutaneous TID WC  . insulin aspart  0-5 Units Subcutaneous QHS  . insulin glargine  15 Units Subcutaneous QHS  . living well with diabetes book   Does not apply Once  . metFORMIN  500 mg Oral BID WC  . pantoprazole  40 mg Oral Q1200  . sodium chloride  3 mL Intravenous Q12H  . DISCONTD: insulin glargine  15 Units Subcutaneous NOW   Continuous Infusions:    . sodium chloride 75 mL/hr at 10/28/11 1750   PRN Meds:.acetaminophen, acetaminophen, dextrose, morphine, ondansetron (ZOFRAN) IV, ondansetron, oxyCODONE, polyethylene glycol  Assessment/Plan: 1-Diabetes mellitus with hyperosmolarity: Hyperosmolar state resolve at this moment. Adjust lantus and continue SSI. Will also add metformin and teach/coach self-injection for lantus.  2-Acute renal failure: Secondary to severe dehydration and volume contraction due to  hyperosmolality state, at this point resolved; Cr WNL.  3-Hyperkalemia: Resolved.  4-Hypernatremia: Resolved.  5-Dehydration: Resolved after IV fluid resuscitation.  6-GERD (gastroesophageal reflux disease): Continue PPI.  7-DVT: Continue Lovenox.    LOS: 2 days   Gaylin Osoria [facility] [phone]  10/29/2011, 4:00 PM

## 2011-10-29 NOTE — Plan of Care (Signed)
Problem: Food- and Nutrition-Related Knowledge Deficit (NB-1.1) Goal: Nutrition education Formal process to instruct or train a patient/client in a skill or to impart knowledge to help patients/clients voluntarily manage or modify food choices and eating behavior to maintain or improve health.  Outcome: Completed/Met Date Met:  10/29/11 Patient educated on carbohydrate counting for diabetes. We discussed eating consistent carbs at meals and snacks, portion size, label reading, and meal planning. Patient was given an Production manager.

## 2011-10-30 LAB — GLUCOSE, CAPILLARY
Glucose-Capillary: 269 mg/dL — ABNORMAL HIGH (ref 70–99)
Glucose-Capillary: 239 mg/dL — ABNORMAL HIGH (ref 70–99)

## 2011-10-30 MED ORDER — INSULIN PEN NEEDLE 31G X 8 MM MISC: 1.0000 "pen " | Freq: Every day | Status: DC

## 2011-10-30 MED ORDER — METFORMIN HCL 500 MG PO TABS: 500.0000 mg | ORAL_TABLET | Freq: Two times a day (BID) | ORAL | Status: DC

## 2011-10-30 MED ORDER — INSULIN GLARGINE 100 UNIT/ML ~~LOC~~ SOLN: 30.0000 [IU] | Freq: Every day | SUBCUTANEOUS | Status: DC

## 2011-10-30 MED ORDER — LIVING WELL WITH DIABETES BOOK: 1.0000 | Freq: Once | Status: DC

## 2011-10-30 MED ORDER — PANTOPRAZOLE SODIUM 40 MG PO TBEC: 40.0000 mg | DELAYED_RELEASE_TABLET | Freq: Every day | ORAL | Status: DC

## 2011-10-30 NOTE — Progress Notes (Signed)
Pt self-administered (with instruction and guidance) his Lantus and Novolog injections tonight. I spoke with patient about the difference between the two insulins in regards to action, injection sites, diabetic foot care, and answered several questions that were asked by himself and his girlfriend. Pt seems very eager and ready to learn, although nervous as well. Pt especially concerned with not having a primary care doctor and requesting a recommendation from the doctor. Sticky note left for MD

## 2011-10-30 NOTE — Progress Notes (Signed)
Appointment with DR. Elby Showers [phone], 11/14/11 at 1000 am/made. mp

## 2011-10-30 NOTE — Discharge Summary (Signed)
Physician Discharge Summary  Patient ID: Lee Hunt MRN: [id number] DOB/AGE: 1976/06/04 35 y.o.  Admit date: 10/27/2011 Discharge date: 10/30/2011  Primary Care Physician:  Will start following with Dr. Elby Showers (11/14/11)   Discharge Diagnoses:   .Diabetes mellitus with hyperosmolarity .Acute renal failure .Hyperkalemia .Hypernatremia .Dehydration .GERD (gastroesophageal reflux disease)   Medication List  As of 10/30/2011 12:49 PM   STOP taking these medications         ranitidine 75 MG tablet      TYLENOL COLD RELIEF NIGHTTIME 25-500 MG/15ML Liqd         TAKE these medications         insulin glargine 100 UNIT/ML injection   Commonly known as: LANTUS   Inject 30 Units into the skin at bedtime.      Insulin Pen Needle 31G X 8 MM Misc   1 pen by Does not apply route at bedtime.      living well with diabetes book Misc   1 each by Does not apply route once.      metFORMIN 500 MG tablet   Commonly known as: GLUCOPHAGE   Take 1 tablet (500 mg total) by mouth 2 (two) times daily with a meal.      pantoprazole 40 MG tablet   Commonly known as: PROTONIX   Take 1 tablet (40 mg total) by mouth daily at 12 noon.             Disposition and Follow-up:  Patient discharge in stable and improved condition; he will follow with Dr. Clent Ridges on 11/14/11 to establish PCP care and have medication adjusted as needed. He also needs a lipid profile as part of general stratification assessment. During follow up he will need a BMET to follow electrolytes and kidney function. Patient advised to follow low carb diet, to check CBG TID and to take medications as prescribed.  Consults:   None   Significant Diagnostic Studies:  No results found.   Brief H and P: 36 year old male without significant past medical history; came from [place] origin care for new onset of hyperglycemia, with associated generalized fatigue, polyuria and polydipsia for the past several weeks. She reports  that for the last 2 weeks he has been feeling more tired than usual, and oriented he has decreased appetite, with increased thirst and increased urination. Patient denies any dysuria, chest pain, fever, chills, headache, sore throat or shortness of breath. Patient reports having some nausea/vomiting over the last 2 days.   Of note patient has history of first degree relative with diabetes, but denies ever being diagnosed with diabetes in the past or having any medical problems.    Hospital Course:  1-Diabetes mellitus with hyperosmolarity: due to newly diagnosed uncontrolled type 2; patient with electrolytes abnormalities and hyperosmolar state resolved; discharge on Lantus and metformin. He will follow with PCP in 2 weeks for further evaluation adn medication adjustment. Will also follow with Diabetes education program as an outpatient for further support.  2-Acute renal failure:2/2 number 1; resolved with IVF's.  3-Hyperkalemia: resolved. Potassium WNL at discharged. Associated with problem #1.  4-Hypernatremia: resolved and stable at discharge. Patient will keep himself hydrated.  5-GERD (gastroesophageal reflux disease): continue PPI.   Time spent on Discharge: 45 minutes  Signed: Norita Meigs 10/30/2011, 12:49 PM

## 2011-10-30 NOTE — Progress Notes (Signed)
Results for MARQUETT, BERTOLI (MRN [id number]) as of 10/30/2011 12:17  Ref. Range 10/29/2011 11:53 10/29/2011 12:04 10/29/2011 17:09 10/29/2011 21:41 10/30/2011 07:29  Glucose-Capillary Latest Range: 70-99 mg/dL 782 (H)  956 (H) 213 (H) 269 (H)   Pt getting ready for discharge to home.  States he is very motivated to make changes with diet and exercise and understands importance of controlling blood sugars at home.  Has MD appt for f/u on February 19th with Dr. Clent Ridges.  Instructed pt to take blood sugar log to appt. Discussed insulin admin, hypoglycemia treatment, diet, exercise and general diabetes basic skills for newly-diagnosed DM.  Has written materials and has viewed diabetes videos on pt ed channel.  Answered questions. Requested OP Diabetes Educ consult for newly diagnosed DM.  Will need prescription for meter, lancets and strips.  Pt will go home on Lantus and metformin.  Ailene Ards, RD, LDN, CDE Inpatient Diabetes Coordinator [phone]

## 2011-11-03 NOTE — ED Provider Notes (Signed)
Medical screening examination/treatment/procedure(s) were conducted as a shared visit with non-physician practitioner(s) and myself.  I personally evaluated the patient during the encounter.  Please see completed note for this encounter  Raeford Razor, MD 11/03/11 [phone]

## 2013-03-05 LAB — HM DIABETES EYE EXAM

## 2013-03-25 ENCOUNTER — Encounter: Payer: Self-pay | Admitting: Internal Medicine

## 2013-03-25 ENCOUNTER — Other Ambulatory Visit (INDEPENDENT_AMBULATORY_CARE_PROVIDER_SITE_OTHER): Payer: BC Managed Care – PPO

## 2013-03-25 ENCOUNTER — Ambulatory Visit (INDEPENDENT_AMBULATORY_CARE_PROVIDER_SITE_OTHER): Payer: BC Managed Care – PPO | Admitting: Internal Medicine

## 2013-03-25 VITALS — BP 120/78 | HR 73 | Temp 97.8°F | Resp 16 | Ht 69.0 in | Wt 228.1 lb

## 2013-03-25 DIAGNOSIS — Z23 Encounter for immunization: Secondary | ICD-10-CM

## 2013-03-25 DIAGNOSIS — E785 Hyperlipidemia, unspecified: Secondary | ICD-10-CM | POA: Insufficient documentation

## 2013-03-25 DIAGNOSIS — Z0000 Encounter for general adult medical examination without abnormal findings: Secondary | ICD-10-CM | POA: Insufficient documentation

## 2013-03-25 DIAGNOSIS — L851 Acquired keratosis [keratoderma] palmaris et plantaris: Secondary | ICD-10-CM

## 2013-03-25 DIAGNOSIS — IMO0001 Reserved for inherently not codable concepts without codable children: Secondary | ICD-10-CM

## 2013-03-25 DIAGNOSIS — L859 Epidermal thickening, unspecified: Secondary | ICD-10-CM

## 2013-03-25 LAB — HM DIABETES FOOT EXAM

## 2013-03-25 LAB — COMPREHENSIVE METABOLIC PANEL
Albumin: 4.2 g/dL (ref 3.5–5.2)
Alkaline Phosphatase: 53 U/L (ref 39–117)
BUN: 10 mg/dL (ref 6–23)
CO2: 26 mEq/L (ref 19–32)
Calcium: 9.3 mg/dL (ref 8.4–10.5)
Chloride: 104 mEq/L (ref 96–112)
Glucose, Bld: 93 mg/dL (ref 70–99)
Potassium: 4.4 mEq/L (ref 3.5–5.1)
Sodium: 138 mEq/L (ref 135–145)
Total Protein: 7.7 g/dL (ref 6.0–8.3)

## 2013-03-25 LAB — URINALYSIS, ROUTINE W REFLEX MICROSCOPIC
Bilirubin Urine: NEGATIVE
Hgb urine dipstick: NEGATIVE
Leukocytes, UA: NEGATIVE
Nitrite: NEGATIVE
WBC, UA: NONE SEEN (ref 0–?)
pH: 7.5 (ref 5.0–8.0)

## 2013-03-25 LAB — CBC WITH DIFFERENTIAL/PLATELET
Basophils Relative: 0.4 % (ref 0.0–3.0)
Eosinophils Relative: 1.4 % (ref 0.0–5.0)
Lymphocytes Relative: 31.8 % (ref 12.0–46.0)
Monocytes Absolute: 0.5 10*3/uL (ref 0.1–1.0)
Monocytes Relative: 7.2 % (ref 3.0–12.0)
Neutrophils Relative %: 59.2 % (ref 43.0–77.0)
Platelets: 233 10*3/uL (ref 150.0–400.0)
RBC: 5.1 Mil/uL (ref 4.22–5.81)
WBC: 6.3 10*3/uL (ref 4.5–10.5)

## 2013-03-25 LAB — HEMOGLOBIN A1C: Hgb A1c MFr Bld: 6 % (ref 4.6–6.5)

## 2013-03-25 LAB — LIPID PANEL
HDL: 37.3 mg/dL — ABNORMAL LOW (ref >39.00)
Total CHOL/HDL Ratio: 4

## 2013-03-25 MED ORDER — SITAGLIP PHOS-METFORMIN HCL ER 100-1000 MG PO TB24: 1.0000 | ORAL_TABLET | Freq: Every day | ORAL | Status: DC

## 2013-03-25 MED ORDER — AMMONIUM LACTATE 12 % EX CREA: TOPICAL_CREAM | Freq: Every day | CUTANEOUS | Status: DC

## 2013-03-25 MED ORDER — ONETOUCH ULTRASOFT LANCETS MISC: Status: DC

## 2013-03-25 MED ORDER — GLUCOSE BLOOD VI STRP: ORAL_STRIP | Status: DC

## 2013-03-25 MED ORDER — ROSUVASTATIN CALCIUM 20 MG PO TABS: 20.0000 mg | ORAL_TABLET | Freq: Every day | ORAL | Status: DC

## 2013-03-25 MED ORDER — ONETOUCH ULTRA SYSTEM W/DEVICE KIT: 1.0000 | PACK | Freq: Once | Status: DC

## 2013-03-25 NOTE — Assessment & Plan Note (Signed)
Start crestor Check his FLP CMP TSH today

## 2013-03-25 NOTE — Assessment & Plan Note (Signed)
Exam done Vaccines were updated Labs ordered Pt ed material was given 

## 2013-03-25 NOTE — Progress Notes (Signed)
Subjective:    Patient ID: Lee Hunt, male    DOB: 21-Jun-1976, 37 y.o.   MRN: [id number]  Diabetes He presents for his follow-up diabetic visit. He has type 2 diabetes mellitus. His disease course has been stable. There are no hypoglycemic associated symptoms. Pertinent negatives for hypoglycemia include no dizziness, pallor or tremors. Pertinent negatives for diabetes include no blurred vision, no chest pain, no fatigue, no foot paresthesias, no foot ulcerations, no polydipsia, no polyphagia, no polyuria, no visual change, no weakness and no weight loss. There are no hypoglycemic complications. Symptoms are stable. There are no diabetic complications. Current diabetic treatment includes oral agent (monotherapy) and insulin injections. He is compliant with treatment some of the time. His weight is stable. He is following a generally healthy diet. Meal planning includes avoidance of concentrated sweets. He has not had a previous visit with a dietician. He participates in exercise intermittently. His breakfast blood glucose range is generally 110-130 mg/dl. His lunch blood glucose range is generally 130-140 mg/dl. His dinner blood glucose range is generally 140-180 mg/dl. His highest blood glucose is 140-180 mg/dl. His overall blood glucose range is 140-180 mg/dl. He does not see a podiatrist.Eye exam is current.      Review of Systems  Constitutional: Negative.  Negative for fever, chills, weight loss, diaphoresis, activity change, appetite change, fatigue and unexpected weight change.  HENT: Negative.   Eyes: Negative.  Negative for blurred vision.  Respiratory: Negative.  Negative for cough, chest tightness, shortness of breath, wheezing and stridor.   Cardiovascular: Negative.  Negative for chest pain, palpitations and leg swelling.  Gastrointestinal: Negative.  Negative for nausea, vomiting, abdominal pain and diarrhea.  Endocrine: Negative.  Negative for polydipsia, polyphagia and polyuria.   Genitourinary: Negative.   Musculoskeletal: Negative.   Skin: Positive for rash (dry, hard, scaly areas on both lower legs, L>R, no itching). Negative for color change, pallor and wound.  Allergic/Immunologic: Negative.   Neurological: Negative.  Negative for dizziness, tremors, weakness and light-headedness.  Hematological: Negative.  Negative for adenopathy. Does not bruise/bleed easily.  Psychiatric/Behavioral: Negative.        Objective:   Physical Exam  Vitals reviewed. Constitutional: He is oriented to person, place, and time. He appears well-developed and well-nourished. No distress.  HENT:  Head: Normocephalic and atraumatic.  Mouth/Throat: Oropharynx is clear and moist. No oropharyngeal exudate.  Eyes: Conjunctivae are normal. Right eye exhibits no discharge. Left eye exhibits no discharge. No scleral icterus.  Neck: Normal range of motion. Neck supple. No JVD present. No tracheal deviation present. No thyromegaly present.  Cardiovascular: Normal rate, regular rhythm, normal heart sounds and intact distal pulses.  Exam reveals no gallop and no friction rub.   No murmur heard. Pulmonary/Chest: Effort normal and breath sounds normal. No stridor. No respiratory distress. He has no wheezes. He has no rales. He exhibits no tenderness.  Abdominal: Soft. Bowel sounds are normal. He exhibits no distension and no mass. There is no tenderness. There is no rebound and no guarding. Hernia confirmed negative in the right inguinal area and confirmed negative in the left inguinal area.  Genitourinary: Testes normal and penis normal. Right testis shows no mass, no swelling and no tenderness. Right testis is descended. Left testis shows no mass, no swelling and no tenderness. Left testis is descended. Circumcised. No penile erythema or penile tenderness. No discharge found.  Musculoskeletal: Normal range of motion. He exhibits no edema and no tenderness.  Lymphadenopathy:    He has no  cervical  adenopathy.       Right: No inguinal adenopathy present.       Left: No inguinal adenopathy present.  Neurological: He is oriented to person, place, and time.  Skin: Skin is warm and dry. Rash noted. No purpura noted. Rash is macular and papular. Rash is not maculopapular, not nodular, not pustular, not vesicular and not urticarial. He is not diaphoretic. No cyanosis or erythema. No pallor. Nails show no clubbing.     Psychiatric: He has a normal mood and affect. His behavior is normal. Judgment and thought content normal.     Lab Results  Component Value Date   WBC 7.5 10/29/2011   HGB 15.4 10/29/2011   HCT 45.3 10/29/2011   PLT 161 10/29/2011   GLUCOSE 271* 10/29/2011   ALT 42 10/27/2011   AST 26 10/27/2011   NA 145 10/29/2011   K 4.2 10/29/2011   CL 108 10/29/2011   CREATININE 1.03 10/29/2011   BUN 21 10/29/2011   CO2 23 10/29/2011   TSH 1.216 10/27/2011   HGBA1C 12.3 10/27/2011       Assessment & Plan:

## 2013-03-25 NOTE — Assessment & Plan Note (Signed)
I will check his A1C and his renal function I have asked him to start Janumet-XR Will check his c-peptide level ( ? Type I or type II)

## 2013-03-25 NOTE — Patient Instructions (Signed)
Health Maintenance, Males A healthy lifestyle and preventative care can promote health and wellness.  Maintain regular health, dental, and eye exams.  Eat a healthy diet. Foods like vegetables, fruits, whole grains, low-fat dairy products, and lean protein foods contain the nutrients you need without too many calories. Decrease your intake of foods high in solid fats, added sugars, and salt. Get information about a proper diet from your caregiver, if necessary.  Regular physical exercise is one of the most important things you can do for your health. Most adults should get at least 150 minutes of moderate-intensity exercise (any activity that increases your heart rate and causes you to sweat) each week. In addition, most adults need muscle-strengthening exercises on 2 or more days a week.   Maintain a healthy weight. The body mass index (BMI) is a screening tool to identify possible weight problems. It provides an estimate of body fat based on height and weight. Your caregiver can help determine your BMI, and can help you achieve or maintain a healthy weight. For adults 20 years and older:  A BMI below 18.5 is considered underweight.  A BMI of 18.5 to 24.9 is normal.  A BMI of 25 to 29.9 is considered overweight.  A BMI of 30 and above is considered obese.  Maintain normal blood lipids and cholesterol by exercising and minimizing your intake of saturated fat. Eat a balanced diet with plenty of fruits and vegetables. Blood tests for lipids and cholesterol should begin at age 20 and be repeated every 5 years. If your lipid or cholesterol levels are high, you are over 50, or you are a high risk for heart disease, you may need your cholesterol levels checked more frequently.Ongoing high lipid and cholesterol levels should be treated with medicines, if diet and exercise are not effective.  If you smoke, find out from your caregiver how to quit. If you do not use tobacco, do not start.  If you  choose to drink alcohol, do not exceed 2 drinks per day. One drink is considered to be 12 ounces (355 mL) of beer, 5 ounces (148 mL) of wine, or 1.5 ounces (44 mL) of liquor.  Avoid use of street drugs. Do not share needles with anyone. Ask for help if you need support or instructions about stopping the use of drugs.  High blood pressure causes heart disease and increases the risk of stroke. Blood pressure should be checked at least every 1 to 2 years. Ongoing high blood pressure should be treated with medicines if weight loss and exercise are not effective.  If you are 45 to 37 years old, ask your caregiver if you should take aspirin to prevent heart disease.  Diabetes screening involves taking a blood sample to check your fasting blood sugar level. This should be done once every 3 years, after age 45, if you are within normal weight and without risk factors for diabetes. Testing should be considered at a younger age or be carried out more frequently if you are overweight and have at least 1 risk factor for diabetes.  Colorectal cancer can be detected and often prevented. Most routine colorectal cancer screening begins at the age of 50 and continues through age 75. However, your caregiver may recommend screening at an earlier age if you have risk factors for colon cancer. On a yearly basis, your caregiver may provide home test kits to check for hidden blood in the stool. Use of a small camera at the end of a tube,   to directly examine the colon (sigmoidoscopy or colonoscopy), can detect the earliest forms of colorectal cancer. Talk to your caregiver about this at age 50, when routine screening begins. Direct examination of the colon should be repeated every 5 to 10 years through age 75, unless early forms of pre-cancerous polyps or small growths are found.  Hepatitis C blood testing is recommended for all people born from 1945 through 1965 and any individual with known risks for hepatitis C.  Healthy  men should no longer receive prostate-specific antigen (PSA) blood tests as part of routine cancer screening. Consult with your caregiver about prostate cancer screening.  Testicular cancer screening is not recommended for adolescents or adult males who have no symptoms. Screening includes self-exam, caregiver exam, and other screening tests. Consult with your caregiver about any symptoms you have or any concerns you have about testicular cancer.  Practice safe sex. Use condoms and avoid high-risk sexual practices to reduce the spread of sexually transmitted infections (STIs).  Use sunscreen with a sun protection factor (SPF) of 30 or greater. Apply sunscreen liberally and repeatedly throughout the day. You should seek shade when your shadow is shorter than you. Protect yourself by wearing long sleeves, pants, a wide-brimmed hat, and sunglasses year round, whenever you are outdoors.  Notify your caregiver of new moles or changes in moles, especially if there is a change in shape or color. Also notify your caregiver if a mole is larger than the size of a pencil eraser.  A one-time screening for abdominal aortic aneurysm (AAA) and surgical repair of large AAAs by sound wave imaging (ultrasonography) is recommended for ages 65 to 75 years who are current or former smokers.  Stay current with your immunizations. Document Released: 03/09/2008 Document Revised: 12/04/2011 Document Reviewed: 02/06/2011 [employer] Patient Information 2014 [employer]. Type 2 Diabetes Mellitus, Adult Type 2 diabetes mellitus, often simply referred to as type 2 diabetes, is a long-lasting (chronic) disease. In type 2 diabetes, the pancreas does not make enough insulin (a hormone), the cells are less responsive to the insulin that is made (insulin resistance), or both. Normally, insulin moves sugars from food into the tissue cells. The tissue cells use the sugars for energy. The lack of insulin or the lack of normal response  to insulin causes excess sugars to build up in the blood instead of going into the tissue cells. As a result, high blood sugar (hyperglycemia) develops. The effect of high sugar (glucose) levels can cause many complications. Type 2 diabetes was also previously called adult-onset diabetes but it can occur at any age.  RISK FACTORS  A person is predisposed to developing type 2 diabetes if someone in the family has the disease and also has one or more of the following primary risk factors:  Overweight.  An inactive lifestyle.  A history of consistently eating high-calorie foods. Maintaining a normal weight and regular physical activity can reduce the chance of developing type 2 diabetes. SYMPTOMS  A person with type 2 diabetes may not show symptoms initially. The symptoms of type 2 diabetes appear slowly. The symptoms include:  Increased thirst (polydipsia).  Increased urination (polyuria).  Increased urination during the night (nocturia).  Weight loss. This weight loss may be rapid.  Frequent, recurring infections.  Tiredness (fatigue).  Weakness.  Vision changes, such as blurred vision.  Fruity smell to your breath.  Abdominal pain.  Nausea or vomiting.  Cuts or bruises which are slow to heal.  Tingling or numbness in the hands   or feet. DIAGNOSIS Type 2 diabetes is frequently not diagnosed until complications of diabetes are present. Type 2 diabetes is diagnosed when symptoms or complications are present and when blood glucose levels are increased. Your blood glucose level may be checked by one or more of the following blood tests:  A fasting blood glucose test. You will not be allowed to eat for at least 8 hours before a blood sample is taken.  A random blood glucose test. Your blood glucose is checked at any time of the day regardless of when you ate.  A hemoglobin A1c blood glucose test. A hemoglobin A1c test provides information about blood glucose control over the  previous 3 months.  An oral glucose tolerance test (OGTT). Your blood glucose is measured after you have not eaten (fasted) for 2 hours and then after you drink a glucose-containing beverage. TREATMENT   You may need to take insulin or diabetes medicine daily to keep blood glucose levels in the desired range.  You will need to match insulin dosing with exercise and healthy food choices. The treatment goal is to maintain the before meal blood sugar (preprandial glucose) level at 70 130 mg/dL. HOME CARE INSTRUCTIONS   Have your hemoglobin A1c level checked twice a year.  Perform daily blood glucose monitoring as directed by your caregiver.  Monitor urine ketones when you are ill and as directed by your caregiver.  Take your diabetes medicine or insulin as directed by your caregiver to maintain your blood glucose levels in the desired range.  Never run out of diabetes medicine or insulin. It is needed every day.  Adjust insulin based on your intake of carbohydrates. Carbohydrates can raise blood glucose levels but need to be included in your diet. Carbohydrates provide vitamins, minerals, and fiber which are an essential part of a healthy diet. Carbohydrates are found in fruits, vegetables, whole grains, dairy products, legumes, and foods containing added sugars.    Eat healthy foods. Alternate 3 meals with 3 snacks.  Lose weight if overweight.  Carry a medical alert card or wear your medical alert jewelry.  Carry a 15 gram carbohydrate snack with you at all times to treat low blood glucose (hypoglycemia). Some examples of 15 gram carbohydrate snacks include:  Glucose tablets, 3 or 4   Glucose gel, 15 gram tube  Raisins, 2 tablespoons (24 grams)  Jelly beans, 6  Animal crackers, 8  Regular pop, 4 ounces (120 mL)  Gummy treats, 9  Recognize hypoglycemia. Hypoglycemia occurs with blood glucose levels of 70 mg/dL and below. The risk for hypoglycemia increases when fasting or  skipping meals, during or after intense exercise, and during sleep. Hypoglycemia symptoms can include:  Tremors or shakes.  Decreased ability to concentrate.  Sweating.  Increased heart rate.  Headache.  Dry mouth.  Hunger.  Irritability.  Anxiety.  Restless sleep.  Altered speech or coordination.  Confusion.  Treat hypoglycemia promptly. If you are alert and able to safely swallow, follow the 15:15 rule:  Take 15 20 grams of rapid-acting glucose or carbohydrate. Rapid-acting options include glucose gel, glucose tablets, or 4 ounces (120 mL) of fruit juice, regular soda, or low fat milk.  Check your blood glucose level 15 minutes after taking the glucose.  Take 15 20 grams more of glucose if the repeat blood glucose level is still 70 mg/dL or below.  Eat a meal or snack within 1 hour once blood glucose levels return to normal.    Be alert to polyuria   and polydipsia which are early signs of hyperglycemia. An early awareness of hyperglycemia allows for prompt treatment. Treat hyperglycemia as directed by your caregiver.  Engage in at least 150 minutes of moderate-intensity physical activity a week, spread over at least 3 days of the week or as directed by your caregiver. In addition, you should engage in resistance exercise at least 2 times a week or as directed by your caregiver.  Adjust your medicine and food intake as needed if you start a new exercise or sport.  Follow your sick day plan at any time you are unable to eat or drink as usual.  Avoid tobacco use.  Limit alcohol intake to no more than 1 drink per day for nonpregnant women and 2 drinks per day for men. You should drink alcohol only when you are also eating food. Talk with your caregiver whether alcohol is safe for you. Tell your caregiver if you drink alcohol several times a week.  Follow up with your caregiver regularly.  Schedule an eye exam soon after the diagnosis of type 2 diabetes and then  annually.  Perform daily skin and foot care. Examine your skin and feet daily for cuts, bruises, redness, nail problems, bleeding, blisters, or sores. A foot exam by a caregiver should be done annually.  Brush your teeth and gums at least twice a day and floss at least once a day. Follow up with your dentist regularly.  Share your diabetes management plan with your workplace or school.  Stay up-to-date with immunizations.  Learn to manage stress.  Obtain ongoing diabetes education and support as needed.  Participate in, or seek rehabilitation as needed to maintain or improve independence and quality of life. Request a physical or occupational therapy referral if you are having foot or hand numbness or difficulties with grooming, dressing, eating, or physical activity. SEEK MEDICAL CARE IF:   You are unable to eat food or drink fluids for more than 6 hours.  You have nausea and vomiting for more than 6 hours.  Your blood glucose level is over 240 mg/dL.  There is a change in mental status.  You develop an additional serious illness.  You have diarrhea for more than 6 hours.  You have been sick or have had a fever for a couple of days and are not getting better.  You have pain during any physical activity.  SEEK IMMEDIATE MEDICAL CARE IF:  You have difficulty breathing.  You have moderate to large ketone levels. MAKE SURE YOU:  Understand these instructions.  Will watch your condition.  Will get help right away if you are not doing well or get worse. Document Released: 09/11/2005 Document Revised: 06/05/2012 Document Reviewed: 04/09/2012 [employer] Patient Information 2014 [employer].  

## 2013-03-25 NOTE — Assessment & Plan Note (Signed)
Treat the blood sugar Start lac-hydrin

## 2013-03-26 NOTE — Addendum Note (Signed)
Addended by: Etta Grandchild on: 03/26/2013 07:51 AM   Modules accepted: Orders, Medications

## 2013-06-03 ENCOUNTER — Ambulatory Visit: Payer: BC Managed Care – PPO | Admitting: Internal Medicine

## 2013-06-12 ENCOUNTER — Encounter: Payer: Self-pay | Admitting: Internal Medicine

## 2013-06-12 ENCOUNTER — Ambulatory Visit (INDEPENDENT_AMBULATORY_CARE_PROVIDER_SITE_OTHER): Payer: BC Managed Care – PPO | Admitting: Internal Medicine

## 2013-06-12 ENCOUNTER — Other Ambulatory Visit (INDEPENDENT_AMBULATORY_CARE_PROVIDER_SITE_OTHER): Payer: BC Managed Care – PPO

## 2013-06-12 VITALS — BP 130/80 | HR 60 | Temp 97.4°F | Resp 12 | Wt 233.0 lb

## 2013-06-12 DIAGNOSIS — IMO0001 Reserved for inherently not codable concepts without codable children: Secondary | ICD-10-CM

## 2013-06-12 DIAGNOSIS — Z23 Encounter for immunization: Secondary | ICD-10-CM

## 2013-06-12 LAB — BASIC METABOLIC PANEL
CO2: 31 mEq/L (ref 19–32)
Glucose, Bld: 78 mg/dL (ref 70–99)
Potassium: 4.1 mEq/L (ref 3.5–5.1)
Sodium: 137 mEq/L (ref 135–145)

## 2013-06-12 NOTE — Assessment & Plan Note (Addendum)
I have asked him to restart the insulin Today I will check his Islet cell abs to see if he is more type 1 or type 2 I will check his a1c and will monitor his lytes and renal function

## 2013-06-12 NOTE — Patient Instructions (Addendum)
Type 2 Diabetes Mellitus, Adult Type 2 diabetes mellitus, often simply referred to as type 2 diabetes, is a long-lasting (chronic) disease. In type 2 diabetes, the pancreas does not make enough insulin (a hormone), the cells are less responsive to the insulin that is made (insulin resistance), or both. Normally, insulin moves sugars from food into the tissue cells. The tissue cells use the sugars for energy. The lack of insulin or the lack of normal response to insulin causes excess sugars to build up in the blood instead of going into the tissue cells. As a result, high blood sugar (hyperglycemia) develops. The effect of high sugar (glucose) levels can cause many complications. Type 2 diabetes was also previously called adult-onset diabetes but it can occur at any age.  RISK FACTORS  A person is predisposed to developing type 2 diabetes if someone in the family has the disease and also has one or more of the following primary risk factors:  Overweight.  An inactive lifestyle.  A history of consistently eating high-calorie foods. Maintaining a normal weight and regular physical activity can reduce the chance of developing type 2 diabetes. SYMPTOMS  A person with type 2 diabetes may not show symptoms initially. The symptoms of type 2 diabetes appear slowly. The symptoms include:  Increased thirst (polydipsia).  Increased urination (polyuria).  Increased urination during the night (nocturia).  Weight loss. This weight loss may be rapid.  Frequent, recurring infections.  Tiredness (fatigue).  Weakness.  Vision changes, such as blurred vision.  Fruity smell to your breath.  Abdominal pain.  Nausea or vomiting.  Cuts or bruises which are slow to heal.  Tingling or numbness in the hands or feet. DIAGNOSIS Type 2 diabetes is frequently not diagnosed until complications of diabetes are present. Type 2 diabetes is diagnosed when symptoms or complications are present and when blood  glucose levels are increased. Your blood glucose level may be checked by one or more of the following blood tests:  A fasting blood glucose test. You will not be allowed to eat for at least 8 hours before a blood sample is taken.  A random blood glucose test. Your blood glucose is checked at any time of the day regardless of when you ate.  A hemoglobin A1c blood glucose test. A hemoglobin A1c test provides information about blood glucose control over the previous 3 months.  An oral glucose tolerance test (OGTT). Your blood glucose is measured after you have not eaten (fasted) for 2 hours and then after you drink a glucose-containing beverage. TREATMENT   You may need to take insulin or diabetes medicine daily to keep blood glucose levels in the desired range.  You will need to match insulin dosing with exercise and healthy food choices. The treatment goal is to maintain the before meal blood sugar (preprandial glucose) level at 70 130 mg/dL. HOME CARE INSTRUCTIONS   Have your hemoglobin A1c level checked twice a year.  Perform daily blood glucose monitoring as directed by your caregiver.  Monitor urine ketones when you are ill and as directed by your caregiver.  Take your diabetes medicine or insulin as directed by your caregiver to maintain your blood glucose levels in the desired range.  Never run out of diabetes medicine or insulin. It is needed every day.  Adjust insulin based on your intake of carbohydrates. Carbohydrates can raise blood glucose levels but need to be included in your diet. Carbohydrates provide vitamins, minerals, and fiber which are an essential part of   a healthy diet. Carbohydrates are found in fruits, vegetables, whole grains, dairy products, legumes, and foods containing added sugars.    Eat healthy foods. Alternate 3 meals with 3 snacks.  Lose weight if overweight.  Carry a medical alert card or wear your medical alert jewelry.  Carry a 15 gram  carbohydrate snack with you at all times to treat low blood glucose (hypoglycemia). Some examples of 15 gram carbohydrate snacks include:  Glucose tablets, 3 or 4   Glucose gel, 15 gram tube  Raisins, 2 tablespoons (24 grams)  Jelly beans, 6  Animal crackers, 8  Regular pop, 4 ounces (120 mL)  Gummy treats, 9  Recognize hypoglycemia. Hypoglycemia occurs with blood glucose levels of 70 mg/dL and below. The risk for hypoglycemia increases when fasting or skipping meals, during or after intense exercise, and during sleep. Hypoglycemia symptoms can include:  Tremors or shakes.  Decreased ability to concentrate.  Sweating.  Increased heart rate.  Headache.  Dry mouth.  Hunger.  Irritability.  Anxiety.  Restless sleep.  Altered speech or coordination.  Confusion.  Treat hypoglycemia promptly. If you are alert and able to safely swallow, follow the 15:15 rule:  Take 15 20 grams of rapid-acting glucose or carbohydrate. Rapid-acting options include glucose gel, glucose tablets, or 4 ounces (120 mL) of fruit juice, regular soda, or low fat milk.  Check your blood glucose level 15 minutes after taking the glucose.  Take 15 20 grams more of glucose if the repeat blood glucose level is still 70 mg/dL or below.  Eat a meal or snack within 1 hour once blood glucose levels return to normal.    Be alert to polyuria and polydipsia which are early signs of hyperglycemia. An early awareness of hyperglycemia allows for prompt treatment. Treat hyperglycemia as directed by your caregiver.  Engage in at least 150 minutes of moderate-intensity physical activity a week, spread over at least 3 days of the week or as directed by your caregiver. In addition, you should engage in resistance exercise at least 2 times a week or as directed by your caregiver.  Adjust your medicine and food intake as needed if you start a new exercise or sport.  Follow your sick day plan at any time you  are unable to eat or drink as usual.  Avoid tobacco use.  Limit alcohol intake to no more than 1 drink per day for nonpregnant women and 2 drinks per day for men. You should drink alcohol only when you are also eating food. Talk with your caregiver whether alcohol is safe for you. Tell your caregiver if you drink alcohol several times a week.  Follow up with your caregiver regularly.  Schedule an eye exam soon after the diagnosis of type 2 diabetes and then annually.  Perform daily skin and foot care. Examine your skin and feet daily for cuts, bruises, redness, nail problems, bleeding, blisters, or sores. A foot exam by a caregiver should be done annually.  Brush your teeth and gums at least twice a day and floss at least once a day. Follow up with your dentist regularly.  Share your diabetes management plan with your workplace or school.  Stay up-to-date with immunizations.  Learn to manage stress.  Obtain ongoing diabetes education and support as needed.  Participate in, or seek rehabilitation as needed to maintain or improve independence and quality of life. Request a physical or occupational therapy referral if you are having foot or hand numbness or difficulties with grooming,   dressing, eating, or physical activity. SEEK MEDICAL CARE IF:   You are unable to eat food or drink fluids for more than 6 hours.  You have nausea and vomiting for more than 6 hours.  Your blood glucose level is over 240 mg/dL.  There is a change in mental status.  You develop an additional serious illness.  You have diarrhea for more than 6 hours.  You have been sick or have had a fever for a couple of days and are not getting better.  You have pain during any physical activity.  SEEK IMMEDIATE MEDICAL CARE IF:  You have difficulty breathing.  You have moderate to large ketone levels. MAKE SURE YOU:  Understand these instructions.  Will watch your condition.  Will get help right away if  you are not doing well or get worse. Document Released: 09/11/2005 Document Revised: 06/05/2012 Document Reviewed: 04/09/2012 [employer] Patient Information 2014 [employer].  

## 2013-06-12 NOTE — Progress Notes (Signed)
  Subjective:    Patient ID: Lee Hunt, male    DOB: July 26, 1976, 37 y.o.   MRN: [id number]  Diabetes He presents for his follow-up diabetic visit. He has type 2 diabetes mellitus. His disease course has been stable. There are no hypoglycemic associated symptoms. Pertinent negatives for hypoglycemia include no dizziness. Pertinent negatives for diabetes include no blurred vision, no chest pain, no fatigue, no foot paresthesias, no foot ulcerations, no polydipsia, no polyphagia, no polyuria, no visual change, no weakness and no weight loss. There are no hypoglycemic complications. Symptoms are stable. There are no diabetic complications. When asked about current treatments, none were reported. He is compliant with treatment none of the time. His weight is stable. He is following a generally unhealthy diet. When asked about meal planning, he reported none. He has not had a previous visit with a dietician. He never participates in exercise. His breakfast blood glucose range is generally 90-110 mg/dl. His dinner blood glucose range is generally 130-140 mg/dl. His highest blood glucose is 140-180 mg/dl. His overall blood glucose range is 110-130 mg/dl. An ACE inhibitor/angiotensin II receptor blocker is not being taken. He does not see a podiatrist.Eye exam is current.      Review of Systems  Constitutional: Negative.  Negative for fever, chills, weight loss, diaphoresis, activity change, appetite change, fatigue and unexpected weight change.  HENT: Negative.   Eyes: Negative.  Negative for blurred vision.  Respiratory: Negative.  Negative for cough, chest tightness, shortness of breath, wheezing and stridor.   Cardiovascular: Negative.  Negative for chest pain, palpitations and leg swelling.  Gastrointestinal: Negative.  Negative for nausea, vomiting, abdominal pain, diarrhea and constipation.  Endocrine: Negative.  Negative for polydipsia, polyphagia and polyuria.  Genitourinary: Negative.    Musculoskeletal: Negative.  Negative for myalgias, back pain, joint swelling, arthralgias and gait problem.  Skin: Negative.   Allergic/Immunologic: Negative.   Neurological: Negative.  Negative for dizziness, facial asymmetry, weakness, light-headedness and numbness.  Hematological: Negative.  Negative for adenopathy. Does not bruise/bleed easily.  Psychiatric/Behavioral: Negative.        Objective:   Physical Exam  Vitals reviewed. Constitutional: He is oriented to person, place, and time. He appears well-developed and well-nourished. No distress.  HENT:  Head: Normocephalic and atraumatic.  Mouth/Throat: Oropharynx is clear and moist. No oropharyngeal exudate.  Eyes: Conjunctivae are normal. Right eye exhibits no discharge. Left eye exhibits no discharge. No scleral icterus.  Neck: Normal range of motion. Neck supple. No JVD present. No tracheal deviation present. No thyromegaly present.  Cardiovascular: Normal rate, regular rhythm, normal heart sounds and intact distal pulses.  Exam reveals no gallop and no friction rub.   No murmur heard. Pulmonary/Chest: Effort normal and breath sounds normal. No stridor. No respiratory distress. He has no wheezes. He has no rales. He exhibits no tenderness.  Abdominal: Soft. Bowel sounds are normal. He exhibits no distension and no mass. There is no tenderness. There is no rebound and no guarding.  Musculoskeletal: Normal range of motion. He exhibits no edema and no tenderness.  Lymphadenopathy:    He has no cervical adenopathy.  Neurological: He is oriented to person, place, and time.  Skin: Skin is warm and dry. No rash noted. He is not diaphoretic. No erythema. No pallor.          Assessment & Plan:

## 2013-08-31 ENCOUNTER — Encounter (HOSPITAL_COMMUNITY): Payer: Self-pay | Admitting: Emergency Medicine

## 2013-08-31 ENCOUNTER — Emergency Department (HOSPITAL_COMMUNITY)
Admission: EM | Admit: 2013-08-31 | Discharge: 2013-08-31 | Disposition: A | Discharge status: Home / Self Care | Payer: BC Managed Care – PPO | Attending: Emergency Medicine | Admitting: Emergency Medicine

## 2013-08-31 ENCOUNTER — Emergency Department (HOSPITAL_COMMUNITY): Payer: BC Managed Care – PPO

## 2013-08-31 DIAGNOSIS — Z794 Long term (current) use of insulin: Secondary | ICD-10-CM | POA: Insufficient documentation

## 2013-08-31 DIAGNOSIS — E119 Type 2 diabetes mellitus without complications: Secondary | ICD-10-CM | POA: Insufficient documentation

## 2013-08-31 DIAGNOSIS — K219 Gastro-esophageal reflux disease without esophagitis: Secondary | ICD-10-CM | POA: Insufficient documentation

## 2013-08-31 DIAGNOSIS — M545 Low back pain, unspecified: Secondary | ICD-10-CM | POA: Insufficient documentation

## 2013-08-31 DIAGNOSIS — Y92410 Unspecified street and highway as the place of occurrence of the external cause: Secondary | ICD-10-CM | POA: Insufficient documentation

## 2013-08-31 DIAGNOSIS — Y939 Activity, unspecified: Secondary | ICD-10-CM | POA: Insufficient documentation

## 2013-08-31 DIAGNOSIS — Z87891 Personal history of nicotine dependence: Secondary | ICD-10-CM | POA: Insufficient documentation

## 2013-08-31 DIAGNOSIS — Z79899 Other long term (current) drug therapy: Secondary | ICD-10-CM | POA: Insufficient documentation

## 2013-08-31 DIAGNOSIS — IMO0001 Reserved for inherently not codable concepts without codable children: Secondary | ICD-10-CM | POA: Insufficient documentation

## 2013-08-31 MED ORDER — CYCLOBENZAPRINE HCL 10 MG PO TABS: 10.0000 mg | ORAL_TABLET | Freq: Two times a day (BID) | ORAL | Status: DC | PRN

## 2013-08-31 MED ORDER — HYDROCODONE-ACETAMINOPHEN 5-325 MG PO TABS: 1.0000 | ORAL_TABLET | ORAL | Status: DC | PRN

## 2013-08-31 MED ORDER — IBUPROFEN 400 MG PO TABS
800.0000 mg | ORAL_TABLET | Freq: Once | ORAL | Status: AC
  Administered 2013-08-31: 800 mg via ORAL
  Filled 2013-08-31: qty 2
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
## 2013-09-02 NOTE — ED Provider Notes (Signed)
Medical screening examination/treatment/procedure(s) were performed by non-physician practitioner and as supervising physician I was immediately available for consultation/collaboration.    Vida Roller, MD 09/02/13 [phone]

## 2013-10-13 ENCOUNTER — Ambulatory Visit (INDEPENDENT_AMBULATORY_CARE_PROVIDER_SITE_OTHER): Payer: BC Managed Care – PPO | Admitting: Internal Medicine

## 2013-10-13 ENCOUNTER — Encounter: Payer: Self-pay | Admitting: Internal Medicine

## 2013-10-13 ENCOUNTER — Other Ambulatory Visit (INDEPENDENT_AMBULATORY_CARE_PROVIDER_SITE_OTHER): Payer: BC Managed Care – PPO

## 2013-10-13 ENCOUNTER — Ambulatory Visit: Payer: BC Managed Care – PPO | Admitting: Internal Medicine

## 2013-10-13 VITALS — BP 124/82 | HR 84 | Temp 98.6°F | Resp 16 | Ht 69.0 in | Wt 236.0 lb

## 2013-10-13 DIAGNOSIS — E1165 Type 2 diabetes mellitus with hyperglycemia: Secondary | ICD-10-CM

## 2013-10-13 DIAGNOSIS — IMO0001 Reserved for inherently not codable concepts without codable children: Secondary | ICD-10-CM

## 2013-10-13 DIAGNOSIS — L851 Acquired keratosis [keratoderma] palmaris et plantaris: Secondary | ICD-10-CM

## 2013-10-13 DIAGNOSIS — L738 Other specified follicular disorders: Secondary | ICD-10-CM | POA: Insufficient documentation

## 2013-10-13 DIAGNOSIS — L859 Epidermal thickening, unspecified: Secondary | ICD-10-CM

## 2013-10-13 LAB — BASIC METABOLIC PANEL
BUN: 10 mg/dL (ref 6–23)
CO2: 29 mEq/L (ref 19–32)
CREATININE: 1 mg/dL (ref 0.4–1.5)
Calcium: 8.9 mg/dL (ref 8.4–10.5)
Chloride: 103 mEq/L (ref 96–112)
GFR: 103.3 mL/min (ref >60.00)
GLUCOSE: 129 mg/dL — AB (ref 70–99)
Potassium: 3.8 mEq/L (ref 3.5–5.1)
Sodium: 138 mEq/L (ref 135–145)

## 2013-10-13 LAB — HEMOGLOBIN A1C: Hgb A1c MFr Bld: 6.8 % — ABNORMAL HIGH (ref 4.6–6.5)

## 2013-10-13 MED ORDER — DOXYCYCLINE HYCLATE 100 MG PO TBEC: 100.0000 mg | DELAYED_RELEASE_TABLET | Freq: Two times a day (BID) | ORAL | Status: DC

## 2013-10-13 MED ORDER — AMMONIUM LACTATE 12 % EX LOTN: 1.0000 "application " | TOPICAL_LOTION | CUTANEOUS | Status: DC | PRN

## 2013-10-13 NOTE — Assessment & Plan Note (Signed)
His A1C is still below 7 so meds are not needed at this time He will work to improve on his lifestyle modifications

## 2013-10-13 NOTE — Progress Notes (Signed)
Pre visit review using our clinic review tool, if applicable. No additional management support is needed unless otherwise documented below in the visit note. 

## 2013-10-13 NOTE — Assessment & Plan Note (Signed)
Will treat this with doxycycline

## 2013-10-13 NOTE — Progress Notes (Signed)
Subjective:    Patient ID: Lee Hunt, male    DOB: 08-13-1976, 38 y.o.   MRN: [id number]  Diabetes He presents for his follow-up diabetic visit. He has type 2 diabetes mellitus. His disease course has been stable. There are no hypoglycemic associated symptoms. Pertinent negatives for hypoglycemia include no pallor. Pertinent negatives for diabetes include no blurred vision, no chest pain, no fatigue, no foot paresthesias, no foot ulcerations, no polydipsia, no polyphagia, no polyuria, no visual change, no weakness and no weight loss. There are no hypoglycemic complications. Current diabetic treatment includes diet. He is compliant with treatment most of the time. His weight is stable. He is following a generally healthy diet. Meal planning includes avoidance of concentrated sweets. He has not had a previous visit with a dietician. He participates in exercise intermittently. His home blood glucose trend is increasing steadily. An ACE inhibitor/angiotensin II receptor blocker is not being taken. He does not see a podiatrist.Eye exam is current.      Review of Systems  Constitutional: Negative.  Negative for fever, chills, weight loss, diaphoresis, appetite change and fatigue.  HENT: Negative.   Eyes: Negative.  Negative for blurred vision.  Respiratory: Negative.  Negative for cough, choking, chest tightness, shortness of breath, wheezing and stridor.   Cardiovascular: Negative.  Negative for chest pain, palpitations and leg swelling.  Gastrointestinal: Negative.  Negative for nausea, vomiting, abdominal pain, diarrhea, constipation and blood in stool.  Endocrine: Negative.  Negative for polydipsia, polyphagia and polyuria.  Genitourinary: Negative.   Musculoskeletal: Negative.   Skin: Positive for rash. Negative for color change, pallor and wound.       He has painful bumps on the back of his scalp for several weeks, they ooze thick yellow exuate  Allergic/Immunologic: Negative.     Neurological: Negative.  Negative for weakness.  Hematological: Negative.  Negative for adenopathy. Does not bruise/bleed easily.  Psychiatric/Behavioral: Negative.        Objective:   Physical Exam  Vitals reviewed. Constitutional: He is oriented to person, place, and time. He appears well-developed and well-nourished. No distress.  HENT:  Head: Normocephalic and atraumatic.    Mouth/Throat: Oropharynx is clear and moist. No oropharyngeal exudate.  Eyes: Conjunctivae are normal. Right eye exhibits no discharge. Left eye exhibits no discharge. No scleral icterus.  Neck: Normal range of motion. Neck supple. No JVD present. No tracheal deviation present. No thyromegaly present.  Cardiovascular: Normal rate, regular rhythm, normal heart sounds and intact distal pulses.  Exam reveals no gallop and no friction rub.   No murmur heard. Pulmonary/Chest: Effort normal and breath sounds normal. No stridor. No respiratory distress. He has no wheezes. He has no rales. He exhibits no tenderness.  Abdominal: Soft. Bowel sounds are normal. He exhibits no distension and no mass. There is no tenderness. There is no rebound and no guarding.  Musculoskeletal: Normal range of motion. He exhibits no edema and no tenderness.  Lymphadenopathy:    He has no cervical adenopathy.  Neurological: He is oriented to person, place, and time.  Skin: Skin is warm and dry. No rash noted. He is not diaphoretic. No erythema. No pallor.  Psychiatric: He has a normal mood and affect. His behavior is normal. Judgment and thought content normal.     Lab Results  Component Value Date   WBC 6.3 03/25/2013   HGB 15.0 03/25/2013   HCT 44.7 03/25/2013   PLT 233.0 03/25/2013   GLUCOSE 78 06/12/2013   CHOL 147 03/25/2013  TRIG 108.0 03/25/2013   HDL 37.30* 03/25/2013   LDLCALC 88 03/25/2013   ALT 30 03/25/2013   AST 22 03/25/2013   NA 137 06/12/2013   K 4.1 06/12/2013   CL 100 06/12/2013   CREATININE 1.0 06/12/2013   BUN 8 06/12/2013    CO2 31 06/12/2013   TSH 1.17 03/25/2013   HGBA1C 6.2 06/12/2013       Assessment & Plan:

## 2013-10-13 NOTE — Patient Instructions (Signed)
Cellulitis Cellulitis is an infection of the skin and the tissue beneath it. The infected area is usually red and tender. Cellulitis occurs most often in the arms and lower legs.  CAUSES  Cellulitis is caused by bacteria that enter the skin through cracks or cuts in the skin. The most common types of bacteria that cause cellulitis are Staphylococcus and Streptococcus. SYMPTOMS   Redness and warmth.  Swelling.  Tenderness or pain.  Fever. DIAGNOSIS  Your caregiver can usually determine what is wrong based on a physical exam. Blood tests may also be done. TREATMENT  Treatment usually involves taking an antibiotic medicine. HOME CARE INSTRUCTIONS   Take your antibiotics as directed. Finish them even if you start to feel better.  Keep the infected arm or leg elevated to reduce swelling.  Apply a warm cloth to the affected area up to 4 times per day to relieve pain.  Only take over-the-counter or prescription medicines for pain, discomfort, or fever as directed by your caregiver.  Keep all follow-up appointments as directed by your caregiver. SEEK MEDICAL CARE IF:   You notice red streaks coming from the infected area.  Your red area gets larger or turns dark in color.  Your bone or joint underneath the infected area becomes painful after the skin has healed.  Your infection returns in the same area or another area.  You notice a swollen bump in the infected area.  You develop new symptoms. SEEK IMMEDIATE MEDICAL CARE IF:   You have a fever.  You feel very sleepy.  You develop vomiting or diarrhea.  You have a general ill feeling (malaise) with muscle aches and pains. MAKE SURE YOU:   Understand these instructions.  Will watch your condition.  Will get help right away if you are not doing well or get worse. Document Released: 06/21/2005 Document Revised: 03/12/2012 Document Reviewed: 11/27/2011 [employer] Patient Information 2014 [employer].  

## 2014-03-09 ENCOUNTER — Ambulatory Visit (INDEPENDENT_AMBULATORY_CARE_PROVIDER_SITE_OTHER): Payer: BC Managed Care – PPO | Admitting: Internal Medicine

## 2014-03-09 DIAGNOSIS — E1165 Type 2 diabetes mellitus with hyperglycemia: Principal | ICD-10-CM

## 2014-03-09 DIAGNOSIS — IMO0001 Reserved for inherently not codable concepts without codable children: Secondary | ICD-10-CM

## 2014-03-23 ENCOUNTER — Other Ambulatory Visit (INDEPENDENT_AMBULATORY_CARE_PROVIDER_SITE_OTHER): Payer: BC Managed Care – PPO

## 2014-03-23 ENCOUNTER — Encounter: Payer: Self-pay | Admitting: Internal Medicine

## 2014-03-23 ENCOUNTER — Ambulatory Visit (INDEPENDENT_AMBULATORY_CARE_PROVIDER_SITE_OTHER): Payer: BC Managed Care – PPO | Admitting: Internal Medicine

## 2014-03-23 VITALS — BP 108/82 | HR 67 | Temp 98.6°F | Resp 16 | Ht 69.0 in | Wt 242.8 lb

## 2014-03-23 DIAGNOSIS — IMO0001 Reserved for inherently not codable concepts without codable children: Secondary | ICD-10-CM

## 2014-03-23 DIAGNOSIS — E785 Hyperlipidemia, unspecified: Secondary | ICD-10-CM

## 2014-03-23 DIAGNOSIS — E1165 Type 2 diabetes mellitus with hyperglycemia: Principal | ICD-10-CM

## 2014-03-23 LAB — LIPID PANEL
CHOLESTEROL: 163 mg/dL (ref 0–200)
HDL: 34.3 mg/dL — AB (ref >39.00)
LDL Cholesterol: 90 mg/dL (ref 0–99)
NonHDL: 128.7
Total CHOL/HDL Ratio: 5
Triglycerides: 195 mg/dL — ABNORMAL HIGH (ref 0.0–149.0)
VLDL: 39 mg/dL (ref 0.0–40.0)

## 2014-03-23 LAB — URINALYSIS, ROUTINE W REFLEX MICROSCOPIC
BILIRUBIN URINE: NEGATIVE
HGB URINE DIPSTICK: NEGATIVE
KETONES UR: NEGATIVE
Leukocytes, UA: NEGATIVE
Nitrite: NEGATIVE
RBC / HPF: NONE SEEN (ref 0–?)
Specific Gravity, Urine: 1.01 (ref 1.000–1.030)
Total Protein, Urine: NEGATIVE
URINE GLUCOSE: 100 — AB
Urobilinogen, UA: 0.2 (ref 0.0–1.0)
WBC, UA: NONE SEEN (ref 0–?)
pH: 8 (ref 5.0–8.0)

## 2014-03-23 LAB — BASIC METABOLIC PANEL
BUN: 10 mg/dL (ref 6–23)
CALCIUM: 8.9 mg/dL (ref 8.4–10.5)
CO2: 29 meq/L (ref 19–32)
CREATININE: 1 mg/dL (ref 0.4–1.5)
Chloride: 100 mEq/L (ref 96–112)
GFR: 113.03 mL/min (ref >60.00)
Glucose, Bld: 125 mg/dL — ABNORMAL HIGH (ref 70–99)
Potassium: 3.9 mEq/L (ref 3.5–5.1)
SODIUM: 136 meq/L (ref 135–145)

## 2014-03-23 LAB — MICROALBUMIN / CREATININE URINE RATIO
CREATININE, U: 75 mg/dL
MICROALB UR: 0.2 mg/dL (ref 0.0–1.9)
Microalb Creat Ratio: 0.3 mg/g (ref 0.0–30.0)

## 2014-03-23 LAB — HEMOGLOBIN A1C: Hgb A1c MFr Bld: 7.1 % — ABNORMAL HIGH (ref 4.6–6.5)

## 2014-03-23 LAB — TSH: TSH: 0.78 u[IU]/mL (ref 0.35–4.50)

## 2014-03-23 NOTE — Progress Notes (Signed)
   Subjective:    Patient ID: Lee Hunt, male    DOB: 03-30-1976, 38 y.o.   MRN: [id number]  Diabetes He presents for his follow-up diabetic visit. He has type 2 diabetes mellitus. His disease course has been fluctuating. There are no hypoglycemic associated symptoms. Pertinent negatives for diabetes include no blurred vision, no chest pain, no fatigue, no foot paresthesias, no foot ulcerations, no polydipsia, no polyphagia, no polyuria, no visual change, no weakness and no weight loss. There are no hypoglycemic complications. There are no diabetic complications. When asked about current treatments, none were reported. He is compliant with treatment none of the time. His weight is increasing steadily. He is following a generally unhealthy diet. When asked about meal planning, he reported none. He has not had a previous visit with a dietician. He never participates in exercise. His home blood glucose trend is increasing steadily. An ACE inhibitor/angiotensin II receptor blocker is not being taken. He does not see a podiatrist.Eye exam is not current.      Review of Systems  Constitutional: Negative for weight loss and fatigue.  Eyes: Negative for blurred vision.  Cardiovascular: Negative for chest pain.  Endocrine: Negative for polydipsia, polyphagia and polyuria.  Neurological: Negative for weakness.  All other systems reviewed and are negative.      Objective:   Physical Exam  Vitals reviewed. Constitutional: He is oriented to person, place, and time. He appears well-developed and well-nourished. No distress.  HENT:  Head: Normocephalic and atraumatic.  Mouth/Throat: Oropharynx is clear and moist. No oropharyngeal exudate.  Eyes: Conjunctivae are normal. Right eye exhibits no discharge. Left eye exhibits no discharge. No scleral icterus.  Neck: Normal range of motion. Neck supple. No JVD present. No tracheal deviation present. No thyromegaly present.  Cardiovascular: Normal rate,  regular rhythm, normal heart sounds and intact distal pulses.  Exam reveals no gallop and no friction rub.   No murmur heard. Pulmonary/Chest: Effort normal and breath sounds normal. No stridor. No respiratory distress. He has no wheezes. He has no rales. He exhibits no tenderness.  Abdominal: Soft. Bowel sounds are normal. He exhibits no distension and no mass. There is no tenderness. There is no rebound and no guarding.  Musculoskeletal: Normal range of motion. He exhibits no edema and no tenderness.  Lymphadenopathy:    He has no cervical adenopathy.  Neurological: He is oriented to person, place, and time.  Skin: Skin is warm and dry. No rash noted. He is not diaphoretic. No erythema. No pallor.  Psychiatric: He has a normal mood and affect. His behavior is normal. Judgment and thought content normal.     Lab Results  Component Value Date   WBC 6.3 03/25/2013   HGB 15.0 03/25/2013   HCT 44.7 03/25/2013   PLT 233.0 03/25/2013   GLUCOSE 129* 10/13/2013   CHOL 147 03/25/2013   TRIG 108.0 03/25/2013   HDL 37.30* 03/25/2013   LDLCALC 88 03/25/2013   ALT 30 03/25/2013   AST 22 03/25/2013   NA 138 10/13/2013   K 3.8 10/13/2013   CL 103 10/13/2013   CREATININE 1.0 10/13/2013   BUN 10 10/13/2013   CO2 29 10/13/2013   TSH 1.17 03/25/2013   HGBA1C 6.8* 10/13/2013       Assessment & Plan:

## 2014-03-23 NOTE — Progress Notes (Signed)
Pre visit review using our clinic review tool, if applicable. No additional management support is needed unless otherwise documented below in the visit note. 

## 2014-03-23 NOTE — Patient Instructions (Signed)
Type 2 Diabetes Mellitus, Adult Type 2 diabetes mellitus, often simply referred to as type 2 diabetes, is a long-lasting (chronic) disease. In type 2 diabetes, the pancreas does not make enough insulin (a hormone), the cells are less responsive to the insulin that is made (insulin resistance), or both. Normally, insulin moves sugars from food into the tissue cells. The tissue cells use the sugars for energy. The lack of insulin or the lack of normal response to insulin causes excess sugars to build up in the blood instead of going into the tissue cells. As a result, high blood sugar (hyperglycemia) develops. The effect of high sugar (glucose) levels can cause many complications. Type 2 diabetes was also previously called adult-onset diabetes but it can occur at any age.  RISK FACTORS  A person is predisposed to developing type 2 diabetes if someone in the family has the disease and also has one or more of the following primary risk factors:  Overweight.  An inactive lifestyle.  A history of consistently eating high-calorie foods. Maintaining a normal weight and regular physical activity can reduce the chance of developing type 2 diabetes. SYMPTOMS  A person with type 2 diabetes may not show symptoms initially. The symptoms of type 2 diabetes appear slowly. The symptoms include:  Increased thirst (polydipsia).  Increased urination (polyuria).  Increased urination during the night (nocturia).  Weight loss. This weight loss may be rapid.  Frequent, recurring infections.  Tiredness (fatigue).  Weakness.  Vision changes, such as blurred vision.  Fruity smell to your breath.  Abdominal pain.  Nausea or vomiting.  Cuts or bruises which are slow to heal.  Tingling or numbness in the hands or feet. DIAGNOSIS Type 2 diabetes is frequently not diagnosed until complications of diabetes are present. Type 2 diabetes is diagnosed when symptoms or complications are present and when blood  glucose levels are increased. Your blood glucose level may be checked by one or more of the following blood tests:  A fasting blood glucose test. You will not be allowed to eat for at least 8 hours before a blood sample is taken.  A random blood glucose test. Your blood glucose is checked at any time of the day regardless of when you ate.  A hemoglobin A1c blood glucose test. A hemoglobin A1c test provides information about blood glucose control over the previous 3 months.  An oral glucose tolerance test (OGTT). Your blood glucose is measured after you have not eaten (fasted) for 2 hours and then after you drink a glucose-containing beverage. TREATMENT   You may need to take insulin or diabetes medicine daily to keep blood glucose levels in the desired range.  If you use insulin, you may need to adjust the dosage depending on the carbohydrates that you eat with each meal or snack. The treatment goal is to maintain the before meal blood sugar (preprandial glucose) level at 70-130 mg/dL. HOME CARE INSTRUCTIONS   Have your hemoglobin A1c level checked twice a year.  Perform daily blood glucose monitoring as directed by your health care Hafiz Irion.  Monitor urine ketones when you are ill and as directed by your health care Graesyn Schreifels.  Take your diabetes medicine or insulin as directed by your health care Melville Engen to maintain your blood glucose levels in the desired range.  Never run out of diabetes medicine or insulin. It is needed every day.  If you are using insulin, you may need to adjust the amount of insulin given based on your intake   of carbohydrates. Carbohydrates can raise blood glucose levels but need to be included in your diet. Carbohydrates provide vitamins, minerals, and fiber which are an essential part of a healthy diet. Carbohydrates are found in fruits, vegetables, whole grains, dairy products, legumes, and foods containing added sugars.  Eat healthy foods. You should make an  appointment to see a registered dietitian to help you create an eating plan that is right for you.  Lose weight if overweight.  Carry a medical alert card or wear your medical alert jewelry.  Carry a 15 gram carbohydrate snack with you at all times to treat low blood glucose (hypoglycemia). Some examples of 15 gram carbohydrate snacks include:  Glucose tablets, 3 or 4  Raisins, 2 tablespoons (24 grams)  Jelly beans, 6  Animal crackers, 8  Regular pop, 4 ounces (120 mL)  Gummy treats, 9  Recognize hypoglycemia. Hypoglycemia occurs with blood glucose levels of 70 mg/dL and below. The risk for hypoglycemia increases when fasting or skipping meals, during or after intense exercise, and during sleep. Hypoglycemia symptoms can include:  Tremors or shakes.  Decreased ability to concentrate.  Sweating.  Increased heart rate.  Headache.  Dry mouth.  Hunger.  Irritability.  Anxiety.  Restless sleep.  Altered speech or coordination.  Confusion.  Treat hypoglycemia promptly. If you are alert and able to safely swallow, follow the 15:15 rule:  Take 15-20 grams of rapid-acting glucose or carbohydrate. Rapid-acting options include glucose gel, glucose tablets, or 4 ounces (120 mL) of fruit juice, regular soda, or low fat milk.  Check your blood glucose level 15 minutes after taking the glucose.  Take 15-20 grams more of glucose if the repeat blood glucose level is still 70 mg/dL or below.  Eat a meal or snack within 1 hour once blood glucose levels return to normal.  Be alert to feeling very thirsty and urinating more frequently than usual, which are early signs of hyperglycemia. An early awareness of hyperglycemia allows for prompt treatment. Treat hyperglycemia as directed by your health care Londyn Hotard.  Engage in at least 150 minutes of moderate-intensity physical activity a week, spread over at least 3 days of the week or as directed by your health care Atzin Buchta. In  addition, you should engage in resistance exercise at least 2 times a week or as directed by your health care Aldrin Engelhard.  Adjust your medicine and food intake as needed if you start a new exercise or sport.  Follow your sick day plan at any time you are unable to eat or drink as usual.  Avoid tobacco use.  Limit alcohol intake to no more than 1 drink per day for nonpregnant women and 2 drinks per day for men. You should drink alcohol only when you are also eating food. Talk with your health care Ixel Boehning whether alcohol is safe for you. Tell your health care Eder Macek if you drink alcohol several times a week.  Follow up with your health care Otilia Kareem regularly.  Schedule an eye exam soon after the diagnosis of type 2 diabetes and then annually.  Perform daily skin and foot care. Examine your skin and feet daily for cuts, bruises, redness, nail problems, bleeding, blisters, or sores. A foot exam by a health care Hayleen Clinkscales should be done annually.  Brush your teeth and gums at least twice a day and floss at least once a day. Follow up with your dentist regularly.  Share your diabetes management plan with your workplace or school.  Stay up-to-date with   immunizations.  Learn to manage stress.  Obtain ongoing diabetes education and support as needed.  Participate in, or seek rehabilitation as needed to maintain or improve independence and quality of life. Request a physical or occupational therapy referral if you are having foot or hand numbness or difficulties with grooming, dressing, eating, or physical activity. SEEK MEDICAL CARE IF:   You are unable to eat food or drink fluids for more than 6 hours.  You have nausea and vomiting for more than 6 hours.  Your blood glucose level is over 240 mg/dL.  There is a change in mental status.  You develop an additional serious illness.  You have diarrhea for more than 6 hours.  You have been sick or have had a fever for a couple of days  and are not getting better.  You have pain during any physical activity.  SEEK IMMEDIATE MEDICAL CARE IF:  You have difficulty breathing.  You have moderate to large ketone levels. MAKE SURE YOU:  Understand these instructions.  Will watch your condition.  Will get help right away if you are not doing well or get worse. Document Released: 09/11/2005 Document Revised: 09/16/2013 Document Reviewed: 04/09/2012 [employer] Patient Information 2015 [employer]. This information is not intended to replace advice given to you by your health care Nyquan Selbe. Make sure you discuss any questions you have with your health care Caidence Higashi.  

## 2014-03-24 MED ORDER — METFORMIN HCL ER 750 MG PO TB24: 750.0000 mg | ORAL_TABLET | Freq: Every day | ORAL | Status: DC

## 2014-03-24 NOTE — Assessment & Plan Note (Signed)
He does not want to start a statin 

## 2014-03-24 NOTE — Assessment & Plan Note (Signed)
His A1C has gone up I have asked him to work on his lifestyle modifications and to see diabetic education Also have asked him to start metformin

## 2014-05-04 LAB — HM DIABETES EYE EXAM

## 2014-05-09 NOTE — Progress Notes (Signed)
   Subjective:    Patient ID: Lee Hunt, male    DOB: 20-May-1976, 38 y.o.   MRN: [id number]  HPI Not seen  Review of Systems     Objective:   Physical Exam        Assessment & Plan:

## 2014-08-05 DIAGNOSIS — Z0279 Encounter for issue of other medical certificate: Secondary | ICD-10-CM
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
## 2015-03-24 ENCOUNTER — Ambulatory Visit: Payer: Self-pay | Admitting: Internal Medicine

## 2015-04-05 ENCOUNTER — Encounter: Payer: Self-pay | Admitting: Internal Medicine

## 2015-04-05 ENCOUNTER — Other Ambulatory Visit (INDEPENDENT_AMBULATORY_CARE_PROVIDER_SITE_OTHER): Payer: BLUE CROSS/BLUE SHIELD

## 2015-04-05 ENCOUNTER — Telehealth: Payer: Self-pay | Admitting: Internal Medicine

## 2015-04-05 ENCOUNTER — Ambulatory Visit (INDEPENDENT_AMBULATORY_CARE_PROVIDER_SITE_OTHER): Payer: BLUE CROSS/BLUE SHIELD | Admitting: Internal Medicine

## 2015-04-05 VITALS — BP 118/84 | HR 64 | Temp 98.3°F | Resp 16 | Ht 69.0 in | Wt 246.0 lb

## 2015-04-05 DIAGNOSIS — E785 Hyperlipidemia, unspecified: Secondary | ICD-10-CM

## 2015-04-05 DIAGNOSIS — L600 Ingrowing nail: Secondary | ICD-10-CM | POA: Diagnosis not present

## 2015-04-05 DIAGNOSIS — K219 Gastro-esophageal reflux disease without esophagitis: Secondary | ICD-10-CM

## 2015-04-05 DIAGNOSIS — E118 Type 2 diabetes mellitus with unspecified complications: Secondary | ICD-10-CM

## 2015-04-05 LAB — CBC WITH DIFFERENTIAL/PLATELET
BASOS ABS: 0 10*3/uL (ref 0.0–0.1)
Basophils Relative: 0.4 % (ref 0.0–3.0)
EOS ABS: 0.1 10*3/uL (ref 0.0–0.7)
EOS PCT: 1.1 % (ref 0.0–5.0)
HCT: 44.4 % (ref 39.0–52.0)
Hemoglobin: 15.1 g/dL (ref 13.0–17.0)
Lymphocytes Relative: 27.9 % (ref 12.0–46.0)
Lymphs Abs: 1.7 10*3/uL (ref 0.7–4.0)
MCHC: 33.9 g/dL (ref 30.0–36.0)
MCV: 86.6 fl (ref 78.0–100.0)
Monocytes Absolute: 0.4 10*3/uL (ref 0.1–1.0)
Monocytes Relative: 6.6 % (ref 3.0–12.0)
NEUTROS ABS: 3.8 10*3/uL (ref 1.4–7.7)
NEUTROS PCT: 64 % (ref 43.0–77.0)
Platelets: 230 10*3/uL (ref 150.0–400.0)
RBC: 5.13 Mil/uL (ref 4.22–5.81)
RDW: 13.4 % (ref 11.5–15.5)
WBC: 6 10*3/uL (ref 4.0–10.5)

## 2015-04-05 LAB — URINALYSIS, ROUTINE W REFLEX MICROSCOPIC
BILIRUBIN URINE: NEGATIVE
Hgb urine dipstick: NEGATIVE
Ketones, ur: NEGATIVE
Leukocytes, UA: NEGATIVE
NITRITE: NEGATIVE
PH: 7 (ref 5.0–8.0)
SPECIFIC GRAVITY, URINE: 1.01 (ref 1.000–1.030)
TOTAL PROTEIN, URINE-UPE24: NEGATIVE
UROBILINOGEN UA: 1 (ref 0.0–1.0)
Urine Glucose: NEGATIVE

## 2015-04-05 LAB — COMPREHENSIVE METABOLIC PANEL
ALBUMIN: 4.3 g/dL (ref 3.5–5.2)
ALT: 32 U/L (ref 0–53)
AST: 20 U/L (ref 0–37)
Alkaline Phosphatase: 59 U/L (ref 39–117)
BILIRUBIN TOTAL: 0.5 mg/dL (ref 0.2–1.2)
BUN: 8 mg/dL (ref 6–23)
CHLORIDE: 103 meq/L (ref 96–112)
CO2: 30 mEq/L (ref 19–32)
Calcium: 8.9 mg/dL (ref 8.4–10.5)
Creatinine, Ser: 0.93 mg/dL (ref 0.40–1.50)
GFR: 116.6 mL/min (ref >60.00)
Glucose, Bld: 112 mg/dL — ABNORMAL HIGH (ref 70–99)
Potassium: 4 mEq/L (ref 3.5–5.1)
Sodium: 137 mEq/L (ref 135–145)
TOTAL PROTEIN: 7.4 g/dL (ref 6.0–8.3)

## 2015-04-05 LAB — LIPID PANEL
CHOL/HDL RATIO: 5
Cholesterol: 145 mg/dL (ref 0–200)
HDL: 31.2 mg/dL — AB (ref >39.00)
LDL Cholesterol: 94 mg/dL (ref 0–99)
NonHDL: 113.8
Triglycerides: 100 mg/dL (ref 0.0–149.0)
VLDL: 20 mg/dL (ref 0.0–40.0)

## 2015-04-05 LAB — HEMOGLOBIN A1C: HEMOGLOBIN A1C: 7.5 % — AB (ref 4.6–6.5)

## 2015-04-05 LAB — MICROALBUMIN / CREATININE URINE RATIO
CREATININE, U: 128.4 mg/dL
MICROALB/CREAT RATIO: 0.6 mg/g (ref 0.0–30.0)
Microalb, Ur: 0.8 mg/dL (ref 0.0–1.9)

## 2015-04-05 LAB — TSH: TSH: 1.25 u[IU]/mL (ref 0.35–4.50)

## 2015-04-05 MED ORDER — METFORMIN HCL ER (MOD) 1000 MG PO TB24: 1000.0000 mg | ORAL_TABLET | Freq: Two times a day (BID) | ORAL | Status: DC

## 2015-04-05 MED ORDER — CEFUROXIME AXETIL 500 MG PO TABS: 500.0000 mg | ORAL_TABLET | Freq: Two times a day (BID) | ORAL | Status: DC
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
## 2015-04-05 NOTE — Telephone Encounter (Signed)
ok 

## 2015-04-05 NOTE — Patient Instructions (Signed)

## 2015-04-05 NOTE — Telephone Encounter (Signed)
Pharmacy called regarding the metFORMIN (GLUMETZA) 1000 MG (MOD) 24 hr tablet [[id number]][[id number]].  They would like to change this to the regular 1000mg  Extended release tablets. This modified kind is more expensive and most insurances won't pay it.

## 2015-04-07 ENCOUNTER — Other Ambulatory Visit: Payer: Self-pay

## 2015-04-07 NOTE — Telephone Encounter (Signed)
Informed pharmacy of Dr. Yetta BarreJones instructions. Pharmacy stated that they did not have the 1000 MG on the "affordable list" they could do 500MG  2 tabs

## 2015-04-07 NOTE — Telephone Encounter (Signed)
ok 

## 2015-04-26 ENCOUNTER — Encounter: Payer: Self-pay | Admitting: Podiatry

## 2015-04-26 ENCOUNTER — Ambulatory Visit (INDEPENDENT_AMBULATORY_CARE_PROVIDER_SITE_OTHER): Payer: BLUE CROSS/BLUE SHIELD | Admitting: Podiatry

## 2015-04-26 ENCOUNTER — Ambulatory Visit (INDEPENDENT_AMBULATORY_CARE_PROVIDER_SITE_OTHER): Payer: BLUE CROSS/BLUE SHIELD

## 2015-04-26 VITALS — BP 104/68 | HR 75 | Resp 15 | Ht 69.0 in | Wt 240.0 lb

## 2015-04-26 DIAGNOSIS — M722 Plantar fascial fibromatosis: Secondary | ICD-10-CM

## 2015-04-26 DIAGNOSIS — M79671 Pain in right foot: Secondary | ICD-10-CM | POA: Diagnosis not present

## 2015-04-26 DIAGNOSIS — L600 Ingrowing nail: Secondary | ICD-10-CM

## 2015-04-26 DIAGNOSIS — M79672 Pain in left foot: Secondary | ICD-10-CM | POA: Diagnosis not present

## 2015-04-26 NOTE — Patient Instructions (Addendum)
ANTIBACTERIAL SOAP INSTRUCTIONS  THE DAY AFTER PROCEDURE  Please follow the instructions your doctor has marked.   Shower as usual. Before getting out, place a drop of antibacterial liquid soap (Dial) on a wet, clean washcloth.  Gently wipe washcloth over affected area.  Afterward, rinse the area with warm water.  Blot the area dry with a soft cloth and cover with antibiotic ointment (neosporin, polysporin, bacitracin) and band aid or gauze and tape  Place 3-4 drops of antibacterial liquid soap in a quart of warm tap water.  Submerge foot into water for 20 minutes.  If bandage was applied after your procedure, leave on to allow for easy lift off, then remove and continue with soak for the remaining time.  Next, blot area dry with a soft cloth and cover with a bandage.  Apply other medications as directed by your doctor, such as cortisporin otic solution (eardrops) or neosporin antibiotic ointment  Plantar Fasciitis (Heel Spur Syndrome) with Rehab The plantar fascia is a fibrous, ligament-like, soft-tissue structure that spans the bottom of the foot. Plantar fasciitis is a condition that causes pain in the foot due to inflammation of the tissue. SYMPTOMS   Pain and tenderness on the underneath side of the foot.  Pain that worsens with standing or walking. CAUSES  Plantar fasciitis is caused by irritation and injury to the plantar fascia on the underneath side of the foot. Common mechanisms of injury include:  Direct trauma to bottom of the foot.  Damage to a small nerve that runs under the foot where the main fascia attaches to the heel bone.  Stress placed on the plantar fascia due to bone spurs. RISK INCREASES WITH:   Activities that place stress on the plantar fascia (running, jumping, pivoting, or cutting).  Poor strength and flexibility.  Improperly fitted shoes.  Tight calf muscles.  Flat feet.  Failure to warm-up properly before activity.  Obesity. PREVENTION  Warm  up and stretch properly before activity.  Allow for adequate recovery between workouts.  Maintain physical fitness:  Strength, flexibility, and endurance.  Cardiovascular fitness.  Maintain a health body weight.  Avoid stress on the plantar fascia.  Wear properly fitted shoes, including arch supports for individuals who have flat feet.  PROGNOSIS  If treated properly, then the symptoms of plantar fasciitis usually resolve without surgery. However, occasionally surgery is necessary.  RELATED COMPLICATIONS   Recurrent symptoms that may result in a chronic condition.  Problems of the lower back that are caused by compensating for the injury, such as limping.  Pain or weakness of the foot during push-off following surgery.  Chronic inflammation, scarring, and partial or complete fascia tear, occurring more often from repeated injections.  TREATMENT  Treatment initially involves the use of ice and medication to help reduce pain and inflammation. The use of strengthening and stretching exercises may help reduce pain with activity, especially stretches of the Achilles tendon. These exercises may be performed at home or with a therapist. Your caregiver may recommend that you use heel cups of arch supports to help reduce stress on the plantar fascia. Occasionally, corticosteroid injections are given to reduce inflammation. If symptoms persist for greater than 6 months despite non-surgical (conservative), then surgery may be recommended.   MEDICATION   If pain medication is necessary, then nonsteroidal anti-inflammatory medications, such as aspirin and ibuprofen, or other minor pain relievers, such as acetaminophen, are often recommended.  Do not take pain medication within 7 days before surgery.  Prescription pain  relievers may be given if deemed necessary by your caregiver. Use only as directed and only as much as you need.  Corticosteroid injections may be given by your caregiver.  These injections should be reserved for the most serious cases, because they may only be given a certain number of times.  HEAT AND COLD  Cold treatment (icing) relieves pain and reduces inflammation. Cold treatment should be applied for 10 to 15 minutes every 2 to 3 hours for inflammation and pain and immediately after any activity that aggravates your symptoms. Use ice packs or massage the area with a piece of ice (ice massage).  Heat treatment may be used prior to performing the stretching and strengthening activities prescribed by your caregiver, physical therapist, or athletic trainer. Use a heat pack or soak the injury in warm water.  SEEK IMMEDIATE MEDICAL CARE IF:  Treatment seems to offer no benefit, or the condition worsens.  Any medications produce adverse side effects.  EXERCISES- RANGE OF MOTION (ROM) AND STRETCHING EXERCISES - Plantar Fasciitis (Heel Spur Syndrome) These exercises may help you when beginning to rehabilitate your injury. Your symptoms may resolve with or without further involvement from your physician, physical therapist or athletic trainer. While completing these exercises, remember:   Restoring tissue flexibility helps normal motion to return to the joints. This allows healthier, less painful movement and activity.  An effective stretch should be held for at least 30 seconds.  A stretch should never be painful. You should only feel a gentle lengthening or release in the stretched tissue.  RANGE OF MOTION - Toe Extension, Flexion  Sit with your right / left leg crossed over your opposite knee.  Grasp your toes and gently pull them back toward the top of your foot. You should feel a stretch on the bottom of your toes and/or foot.  Hold this stretch for 10 seconds.  Now, gently pull your toes toward the bottom of your foot. You should feel a stretch on the top of your toes and or foot.  Hold this stretch for 10 seconds. Repeat  times. Complete this  stretch 3 times per day.   RANGE OF MOTION - Ankle Dorsiflexion, Active Assisted  Remove shoes and sit on a chair that is preferably not on a carpeted surface.  Place right / left foot under knee. Extend your opposite leg for support.  Keeping your heel down, slide your right / left foot back toward the chair until you feel a stretch at your ankle or calf. If you do not feel a stretch, slide your bottom forward to the edge of the chair, while still keeping your heel down.  Hold this stretch for 10 seconds. Repeat 3 times. Complete this stretch 2 times per day.   STRETCH  Gastroc, Standing  Place hands on wall.  Extend right / left leg, keeping the front knee somewhat bent.  Slightly point your toes inward on your back foot.  Keeping your right / left heel on the floor and your knee straight, shift your weight toward the wall, not allowing your back to arch.  You should feel a gentle stretch in the right / left calf. Hold this position for 10 seconds. Repeat 3 times. Complete this stretch 2 times per day.  STRETCH  Soleus, Standing  Place hands on wall.  Extend right / left leg, keeping the other knee somewhat bent.  Slightly point your toes inward on your back foot.  Keep your right / left heel on the  floor, bend your back knee, and slightly shift your weight over the back leg so that you feel a gentle stretch deep in your back calf.  Hold this position for 10 seconds. Repeat 3 times. Complete this stretch 2 times per day.  STRETCH  Gastrocsoleus, Standing  Note: This exercise can place a lot of stress on your foot and ankle. Please complete this exercise only if specifically instructed by your caregiver.   Place the ball of your right / left foot on a step, keeping your other foot firmly on the same step.  Hold on to the wall or a rail for balance.  Slowly lift your other foot, allowing your body weight to press your heel down over the edge of the step.  You should  feel a stretch in your right / left calf.  Hold this position for 10 seconds.  Repeat this exercise with a slight bend in your right / left knee. Repeat 3 times. Complete this stretch 2 times per day.   STRENGTHENING EXERCISES - Plantar Fasciitis (Heel Spur Syndrome)  These exercises may help you when beginning to rehabilitate your injury. They may resolve your symptoms with or without further involvement from your physician, physical therapist or athletic trainer. While completing these exercises, remember:   Muscles can gain both the endurance and the strength needed for everyday activities through controlled exercises.  Complete these exercises as instructed by your physician, physical therapist or athletic trainer. Progress the resistance and repetitions only as guided.  STRENGTH - Towel Curls  Sit in a chair positioned on a non-carpeted surface.  Place your foot on a towel, keeping your heel on the floor.  Pull the towel toward your heel by only curling your toes. Keep your heel on the floor. Repeat 3 times. Complete this exercise 2 times per day.  STRENGTH - Ankle Inversion  Secure one end of a rubber exercise band/tubing to a fixed object (table, pole). Loop the other end around your foot just before your toes.  Place your fists between your knees. This will focus your strengthening at your ankle.  Slowly, pull your big toe up and in, making sure the band/tubing is positioned to resist the entire motion.  Hold this position for 10 seconds.  Have your muscles resist the band/tubing as it slowly pulls your foot back to the starting position. Repeat 3 times. Complete this exercises 2 times per day.  Document Released: 09/11/2005 Document Revised: 12/04/2011 Document Reviewed: 12/24/2008 [employer] Patient Information 2014 [place], [place].Plantar Fasciitis (Heel Spur Syndrome) with Rehab The plantar fascia is a fibrous, ligament-like, soft-tissue structure that spans the  bottom of the foot. Plantar fasciitis is a condition that causes pain in the foot due to inflammation of the tissue. SYMPTOMS   Pain and tenderness on the underneath side of the foot.  Pain that worsens with standing or walking. CAUSES  Plantar fasciitis is caused by irritation and injury to the plantar fascia on the underneath side of the foot. Common mechanisms of injury include:  Direct trauma to bottom of the foot.  Damage to a small nerve that runs under the foot where the main fascia attaches to the heel bone.  Stress placed on the plantar fascia due to bone spurs. RISK INCREASES WITH:   Activities that place stress on the plantar fascia (running, jumping, pivoting, or cutting).  Poor strength and flexibility.  Improperly fitted shoes.  Tight calf muscles.  Flat feet.  Failure to warm-up properly before activity.  Obesity. PREVENTION  Warm up and stretch properly before activity.  Allow for adequate recovery between workouts.  Maintain physical fitness:  Strength, flexibility, and endurance.  Cardiovascular fitness.  Maintain a health body weight.  Avoid stress on the plantar fascia.  Wear properly fitted shoes, including arch supports for individuals who have flat feet.  PROGNOSIS  If treated properly, then the symptoms of plantar fasciitis usually resolve without surgery. However, occasionally surgery is necessary.  RELATED COMPLICATIONS   Recurrent symptoms that may result in a chronic condition.  Problems of the lower back that are caused by compensating for the injury, such as limping.  Pain or weakness of the foot during push-off following surgery.  Chronic inflammation, scarring, and partial or complete fascia tear, occurring more often from repeated injections.  TREATMENT  Treatment initially involves the use of ice and medication to help reduce pain and inflammation. The use of strengthening and stretching exercises may help reduce pain with  activity, especially stretches of the Achilles tendon. These exercises may be performed at home or with a therapist. Your caregiver may recommend that you use heel cups of arch supports to help reduce stress on the plantar fascia. Occasionally, corticosteroid injections are given to reduce inflammation. If symptoms persist for greater than 6 months despite non-surgical (conservative), then surgery may be recommended.   MEDICATION   If pain medication is necessary, then nonsteroidal anti-inflammatory medications, such as aspirin and ibuprofen, or other minor pain relievers, such as acetaminophen, are often recommended.  Do not take pain medication within 7 days before surgery.  Prescription pain relievers may be given if deemed necessary by your caregiver. Use only as directed and only as much as you need.  Corticosteroid injections may be given by your caregiver. These injections should be reserved for the most serious cases, because they may only be given a certain number of times.  HEAT AND COLD  Cold treatment (icing) relieves pain and reduces inflammation. Cold treatment should be applied for 10 to 15 minutes every 2 to 3 hours for inflammation and pain and immediately after any activity that aggravates your symptoms. Use ice packs or massage the area with a piece of ice (ice massage).  Heat treatment may be used prior to performing the stretching and strengthening activities prescribed by your caregiver, physical therapist, or athletic trainer. Use a heat pack or soak the injury in warm water.  SEEK IMMEDIATE MEDICAL CARE IF:  Treatment seems to offer no benefit, or the condition worsens.  Any medications produce adverse side effects.  EXERCISES- RANGE OF MOTION (ROM) AND STRETCHING EXERCISES - Plantar Fasciitis (Heel Spur Syndrome) These exercises may help you when beginning to rehabilitate your injury. Your symptoms may resolve with or without further involvement from your physician,  physical therapist or athletic trainer. While completing these exercises, remember:   Restoring tissue flexibility helps normal motion to return to the joints. This allows healthier, less painful movement and activity.  An effective stretch should be held for at least 30 seconds.  A stretch should never be painful. You should only feel a gentle lengthening or release in the stretched tissue.  RANGE OF MOTION - Toe Extension, Flexion  Sit with your right / left leg crossed over your opposite knee.  Grasp your toes and gently pull them back toward the top of your foot. You should feel a stretch on the bottom of your toes and/or foot.  Hold this stretch for 10 seconds.  Now, gently pull your toes  toward the bottom of your foot. You should feel a stretch on the top of your toes and or foot.  Hold this stretch for 10 seconds. Repeat  times. Complete this stretch 3 times per day.   RANGE OF MOTION - Ankle Dorsiflexion, Active Assisted  Remove shoes and sit on a chair that is preferably not on a carpeted surface.  Place right / left foot under knee. Extend your opposite leg for support.  Keeping your heel down, slide your right / left foot back toward the chair until you feel a stretch at your ankle or calf. If you do not feel a stretch, slide your bottom forward to the edge of the chair, while still keeping your heel down.  Hold this stretch for 10 seconds. Repeat 3 times. Complete this stretch 2 times per day.   STRETCH  Gastroc, Standing  Place hands on wall.  Extend right / left leg, keeping the front knee somewhat bent.  Slightly point your toes inward on your back foot.  Keeping your right / left heel on the floor and your knee straight, shift your weight toward the wall, not allowing your back to arch.  You should feel a gentle stretch in the right / left calf. Hold this position for 10 seconds. Repeat 3 times. Complete this stretch 2 times per day.  STRETCH  Soleus,  Standing  Place hands on wall.  Extend right / left leg, keeping the other knee somewhat bent.  Slightly point your toes inward on your back foot.  Keep your right / left heel on the floor, bend your back knee, and slightly shift your weight over the back leg so that you feel a gentle stretch deep in your back calf.  Hold this position for 10 seconds. Repeat 3 times. Complete this stretch 2 times per day.  STRETCH  Gastrocsoleus, Standing  Note: This exercise can place a lot of stress on your foot and ankle. Please complete this exercise only if specifically instructed by your caregiver.   Place the ball of your right / left foot on a step, keeping your other foot firmly on the same step.  Hold on to the wall or a rail for balance.  Slowly lift your other foot, allowing your body weight to press your heel down over the edge of the step.  You should feel a stretch in your right / left calf.  Hold this position for 10 seconds.  Repeat this exercise with a slight bend in your right / left knee. Repeat 3 times. Complete this stretch 2 times per day.   STRENGTHENING EXERCISES - Plantar Fasciitis (Heel Spur Syndrome)  These exercises may help you when beginning to rehabilitate your injury. They may resolve your symptoms with or without further involvement from your physician, physical therapist or athletic trainer. While completing these exercises, remember:   Muscles can gain both the endurance and the strength needed for everyday activities through controlled exercises.  Complete these exercises as instructed by your physician, physical therapist or athletic trainer. Progress the resistance and repetitions only as guided.  STRENGTH - Towel Curls  Sit in a chair positioned on a non-carpeted surface.  Place your foot on a towel, keeping your heel on the floor.  Pull the towel toward your heel by only curling your toes. Keep your heel on the floor. Repeat 3 times. Complete this  exercise 2 times per day.  STRENGTH - Ankle Inversion  Secure one end of a rubber exercise band/tubing  to a fixed object (table, pole). Loop the other end around your foot just before your toes.  Place your fists between your knees. This will focus your strengthening at your ankle.  Slowly, pull your big toe up and in, making sure the band/tubing is positioned to resist the entire motion.  Hold this position for 10 seconds.  Have your muscles resist the band/tubing as it slowly pulls your foot back to the starting position. Repeat 3 times. Complete this exercises 2 times per day.  Document Released: 09/11/2005 Document Revised: 12/04/2011 Document Reviewed: 12/24/2008 [facility] Patient Information 2014 [place], [place].

## 2015-04-26 NOTE — Progress Notes (Signed)
   Subjective:    Patient ID: Lee Hunt, male    DOB: [birth year], 39 y.o.   MRN: [id number]  HPI Patient presents with an ingrown toenail, left foot, great toe, medial side. Pt stated, "he thinks he has an ingrown toenail". This has been going on for the past 2 months.  Patient also presents with bilateral foot pain, heel of foot. Pt stated, "pain is not constant and does not know how long it has been going on for."   Review of Systems  Eyes: Positive for redness and itching.  Respiratory: Positive for shortness of breath.   Gastrointestinal: Positive for abdominal pain.  Endocrine: Positive for polydipsia and polyuria.  Genitourinary: Positive for urgency and frequency.  Skin: Positive for rash.  Neurological: Positive for headaches.  All other systems reviewed and are negative.      Objective:   Physical Exam        Assessment & Plan:

## 2015-04-27 ENCOUNTER — Telehealth: Payer: Self-pay | Admitting: *Deleted

## 2015-04-27 NOTE — Telephone Encounter (Signed)
Called patient at [phone] (cell #) to check to see how they were feeling from their ingrown toenail procedure that was done on 04/26/15. Pt stated, "they did okay last night and felt little pain". Pt was at work all day and has not soaked their toe yet. When they get off of work, they will soak it.

## 2015-04-28 NOTE — Progress Notes (Signed)
Subjective:     Patient ID: Lee Hunt, male   DOB: [birth year], 39 y.o.   MRN: [id number]  HPI patient presents stating he has flatfoot deformity and also has an ingrown toenail deformity left hallux which has been bothering him quite a bit and making shoe gear painful and difficult. States she's tried to trim it out himself without relief and that the pain in his heels is somewhat consistent in its nature   Review of Systems  All other systems reviewed and are negative.      Objective:   Physical Exam  Constitutional: He is oriented to person, place, and time.  Cardiovascular: Intact distal pulses.   Musculoskeletal: Normal range of motion.  Neurological: He is oriented to person, place, and time.  Skin: Skin is warm.  Nursing note and vitals reviewed.  neurovascular status intact with muscle strength adequate range of motion within normal limits. Patient is noted to have discomfort in the heel region bilateral with moderate depression of the arch noted and is noted to have incurvated left hallux medial border that is painful when pressed and making shoe gear difficult. 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 At this time we'll use over-the-counter insoles and stretching exercises for the heel and I discussed ingrown toenail correction and the risk and complications associated with it. Patient wants surgery and today I infiltrated the left hallux 60 mg Xylocaine Marcaine mixture remove the medial border exposed matrix and applied phenol 3 applications 30 seconds followed by alcohol lavage and sterile dressing. Gave instructions on soaks and reappoint

## 2016-05-26 ENCOUNTER — Encounter: Payer: Self-pay | Admitting: Nurse Practitioner

## 2016-05-26 ENCOUNTER — Other Ambulatory Visit (INDEPENDENT_AMBULATORY_CARE_PROVIDER_SITE_OTHER): Payer: BLUE CROSS/BLUE SHIELD

## 2016-05-26 ENCOUNTER — Ambulatory Visit (INDEPENDENT_AMBULATORY_CARE_PROVIDER_SITE_OTHER): Payer: BLUE CROSS/BLUE SHIELD | Admitting: Nurse Practitioner

## 2016-05-26 VITALS — BP 124/90 | HR 81 | Temp 98.1°F | Ht 69.0 in | Wt 243.5 lb

## 2016-05-26 DIAGNOSIS — K219 Gastro-esophageal reflux disease without esophagitis: Secondary | ICD-10-CM

## 2016-05-26 DIAGNOSIS — Z0001 Encounter for general adult medical examination with abnormal findings: Secondary | ICD-10-CM | POA: Diagnosis not present

## 2016-05-26 DIAGNOSIS — R6889 Other general symptoms and signs: Secondary | ICD-10-CM | POA: Diagnosis not present

## 2016-05-26 DIAGNOSIS — E118 Type 2 diabetes mellitus with unspecified complications: Secondary | ICD-10-CM | POA: Diagnosis not present

## 2016-05-26 DIAGNOSIS — R0602 Shortness of breath: Secondary | ICD-10-CM

## 2016-05-26 DIAGNOSIS — E785 Hyperlipidemia, unspecified: Secondary | ICD-10-CM | POA: Diagnosis not present

## 2016-05-26 DIAGNOSIS — Z0000 Encounter for general adult medical examination without abnormal findings: Secondary | ICD-10-CM

## 2016-05-26 LAB — CBC WITH DIFFERENTIAL/PLATELET
BASOS ABS: 0 10*3/uL (ref 0.0–0.1)
Basophils Relative: 0.2 % (ref 0.0–3.0)
Eosinophils Absolute: 0.1 10*3/uL (ref 0.0–0.7)
Eosinophils Relative: 1.9 % (ref 0.0–5.0)
HCT: 45.3 % (ref 39.0–52.0)
Hemoglobin: 15.6 g/dL (ref 13.0–17.0)
LYMPHS ABS: 1.4 10*3/uL (ref 0.7–4.0)
Lymphocytes Relative: 21.1 % (ref 12.0–46.0)
MCHC: 34.4 g/dL (ref 30.0–36.0)
MCV: 85.8 fl (ref 78.0–100.0)
MONOS PCT: 7.7 % (ref 3.0–12.0)
Monocytes Absolute: 0.5 10*3/uL (ref 0.1–1.0)
NEUTROS ABS: 4.6 10*3/uL (ref 1.4–7.7)
NEUTROS PCT: 69.1 % (ref 43.0–77.0)
PLATELETS: 238 10*3/uL (ref 150.0–400.0)
RBC: 5.28 Mil/uL (ref 4.22–5.81)
RDW: 13.2 % (ref 11.5–15.5)
WBC: 6.7 10*3/uL (ref 4.0–10.5)

## 2016-05-26 LAB — COMPREHENSIVE METABOLIC PANEL
ALT: 25 U/L (ref 0–53)
AST: 13 U/L (ref 0–37)
Albumin: 4.3 g/dL (ref 3.5–5.2)
Alkaline Phosphatase: 82 U/L (ref 39–117)
BUN: 9 mg/dL (ref 6–23)
CALCIUM: 8.9 mg/dL (ref 8.4–10.5)
CHLORIDE: 101 meq/L (ref 96–112)
CO2: 31 meq/L (ref 19–32)
CREATININE: 1.11 mg/dL (ref 0.40–1.50)
GFR: 94.5 mL/min (ref >60.00)
Glucose, Bld: 340 mg/dL — ABNORMAL HIGH (ref 70–99)
POTASSIUM: 4.2 meq/L (ref 3.5–5.1)
SODIUM: 136 meq/L (ref 135–145)
Total Bilirubin: 0.5 mg/dL (ref 0.2–1.2)
Total Protein: 7.3 g/dL (ref 6.0–8.3)

## 2016-05-26 LAB — MICROALBUMIN / CREATININE URINE RATIO
CREATININE, U: 60.1 mg/dL
Microalb Creat Ratio: 1.2 mg/g (ref 0.0–30.0)

## 2016-05-26 LAB — LIPID PANEL
CHOL/HDL RATIO: 5
Cholesterol: 162 mg/dL (ref 0–200)
HDL: 31.5 mg/dL — ABNORMAL LOW (ref >39.00)
LDL CALC: 92 mg/dL (ref 0–99)
NONHDL: 130.78
TRIGLYCERIDES: 192 mg/dL — AB (ref 0.0–149.0)
VLDL: 38.4 mg/dL (ref 0.0–40.0)

## 2016-05-26 LAB — HEMOGLOBIN A1C: Hgb A1c MFr Bld: 8.8 % — ABNORMAL HIGH (ref 4.6–6.5)

## 2016-05-26 LAB — HIV ANTIBODY (ROUTINE TESTING W REFLEX): HIV: NONREACTIVE

## 2016-05-26 NOTE — Assessment & Plan Note (Signed)
Normal ECG. Pending CXR

## 2016-05-26 NOTE — Progress Notes (Signed)
Pre visit review using our clinic review tool, if applicable. No additional management support is needed unless otherwise documented below in the visit note. 

## 2016-05-26 NOTE — Assessment & Plan Note (Signed)
Has not taken metformin x 1year. Repeat hgbA1c. Advised about need for low carb/low salt diet and regular exercise.

## 2016-05-26 NOTE — Assessment & Plan Note (Signed)
Stable No OTC med use at this time

## 2016-05-26 NOTE — Patient Instructions (Addendum)
Maintain low carb and low salt diet Maintain regular exercise Encourage weight loss through diet and exercise if wants to avoid taking statin and metformin.. Will call with lab results.  Return to office on Tuesday for completed form. Pending CXR results

## 2016-05-26 NOTE — Assessment & Plan Note (Addendum)
Advised about need for healthy diet and regular exercise. Repeat lipid profile today.

## 2016-05-26 NOTE — Progress Notes (Signed)
Subjective:    Patient ID: Lee Hunt, male    DOB: May 15, 1976, [age] y.o.   MRN: [id number]  Patient presents today for complete physical. He also complains of shortness of breath.  Shortness of Breath  This is a new problem. The current episode started more than 1 month ago. The problem occurs intermittently. The problem has been unchanged. Associated symptoms include rhinorrhea and a sore throat. Pertinent negatives include no abdominal pain, chest pain, ear pain, fever, headaches, hemoptysis, leg pain, leg swelling, orthopnea, PND, sputum production, swollen glands, syncope, vomiting or wheezing. The symptoms are aggravated by any activity and odors (exertional activity, inadequate rest and strong odors). The patient has no known risk factors for DVT/PE. He has tried nothing for the symptoms. His past medical history is significant for allergies. There is no history of asthma, bronchiolitis, DVT, PE, pneumonia or a recent surgery.   Diabetes: Stop taking metformin 1 year ago. Complains of occasional numbness and tingling in hands and feet. Denies any swelling. No pain with walking. Has not had eyes checked in over 1 year. But will schedule appointment. Denies any visual changes. Wears corrective lens. Has not had dental cleaning several years. He will schedule appointment.  Immunizations: Health Maintenance  Topic Date Due  . Eye exam for diabetics  05/05/2015  . Flu Shot  05/26/2017*  . Hemoglobin A1C  11/23/2016  . Complete foot exam   05/26/2017  . Urine Protein Check  05/26/2017  . Pneumococcal vaccine (2) 03/25/2018  . Tetanus Vaccine  03/26/2023  . HIV Screening  Completed  *Topic was postponed. The date shown is not the original due date.   Diet: none Weight: will like to loss some weight Wt Readings from Last 3 Encounters:  05/26/16 243 lb 8 oz (110.5 kg)  04/26/15 240 lb (108.9 kg)  04/05/15 246 lb (111.6 kg)   Exercise:none Fall Risk: Fall Risk  06/12/2013  Falls  in the past year? No   Depression/Suicide[facility]2013 06/12/2013  Decreased Interest 0 0  Down, Depressed, Hopeless 0 0  PHQ - 2 Score 0 0   No flowsheet data found. Vision: he will schedule Dental:he will schedule Advanced Directive: Advanced Directives 10/27/2011  Does patient have an advance directive? Patient does not have advance directive  Pre-existing out of facility DNR order (yellow form or pink MOST form) No   Sexual History (birth control, marital status, STD):sexually active with wife, denies any erectile dysfunction, no urinary symptoms.  Medications and allergies reviewed with patient and updated if appropriate.  Patient Active Problem List   Diagnosis Date Noted  . SOB (shortness of breath) 05/26/2016  . Hyperlipidemia with target LDL less than 100 03/25/2013  . Routine general medical examination at a health care facility 03/25/2013  . 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 Type II diabetes mellitus with manifestations (HCC) 10/27/2011  . GERD (gastroesophageal reflux disease) 10/27/2011    Current Outpatient Prescriptions on File Prior to Visit  Medication Sig Dispense Refill  . metFORMIN (GLUMETZA) 1000 MG (MOD) 24 hr tablet Take 1 tablet (1,000 mg total) by mouth 2 (two) times daily with a meal. 180 tablet 1   No current facility-administered medications on file prior to visit.     Past Medical History:  Diagnosis Date  . Diabetes mellitus without complication (HCC)   . GERD (gastroesophageal reflux disease)   . No pertinent past medical history     Past Surgical History:  Procedure Laterality Date  . NO  PAST SURGERIES      Social History   Social History  . Marital status: Married    Spouse name: N/A  . Number of children: N/A  . Years of education: N/A   Social History Main Topics  . Smoking status: Former Smoker    Types: Cigarettes    Quit date: 10/26/2006  . Smokeless tobacco: Never Used  . Alcohol use No  . Drug use: No   . Sexual activity: Yes    Birth control/ protection: None   Other Topics Concern  . None   Social History Narrative  . None    Family History  Problem Relation Age of Onset  . Diabetes type II Mother   . Hypertension Mother   . Diabetes Mother   . Lung cancer Father   . Alcohol abuse Neg Hx   . Cancer Neg Hx   . COPD Neg Hx   . Depression Neg Hx   . Drug abuse Neg Hx   . Early death Neg Hx   . Heart disease Neg Hx   . Hyperlipidemia Neg Hx   . Kidney disease Neg Hx   . Stroke Neg Hx         Review of Systems  Constitutional: Negative for fever.  HENT: Positive for congestion, rhinorrhea and sore throat. Negative for ear discharge, ear pain and hearing loss.   Respiratory: Positive for shortness of breath. Negative for hemoptysis, sputum production and wheezing.   Cardiovascular: Negative for chest pain, orthopnea, leg swelling, syncope and PND.  Gastrointestinal: Negative for abdominal pain and vomiting.  Skin: Negative.   Neurological: Negative for headaches.    Objective:   Vitals:   05/26/16 1509  BP: 124/90  Pulse: 81  Temp: 98.1 F (36.7 C)    Body mass index is 35.96 kg/m.   Physical Examination:  Physical Exam  Constitutional: He is oriented to person, place, and time and well-developed, well-nourished, and in no distress.  HENT:  Right Ear: External ear normal.  Left Ear: External ear normal.  Nose: Nose normal.  Mouth/Throat: Oropharynx is clear and moist. No oropharyngeal exudate.  Eyes: Conjunctivae and EOM are normal. Pupils are equal, round, and reactive to light.  Neck: Normal range of motion. Neck supple. No thyromegaly present.  Cardiovascular: Normal rate, normal heart sounds and intact distal pulses.   Pulmonary/Chest: Effort normal and breath sounds normal.  Abdominal: Soft. Bowel sounds are normal.  Musculoskeletal: Normal range of motion. He exhibits no edema.       Right foot: Normal.       Left foot: Normal.  Normal and  equal microfilament sensation. Normal distal pulses.  Lymphadenopathy:    He has no cervical adenopathy.  Neurological: He is alert and oriented to person, place, and time. Gait normal.  Skin: Skin is warm and dry.  Vitals reviewed.   ASSESSMENT and PLAN:  Kavan was seen today for annual exam and shortness of breath.  Diagnoses and all orders for this visit:  Encounter for general adult medical examination with abnormal findings -     HIV antibody (with reflex); Future -     CBC w/Diff; Future  SOB (shortness of breath) -     EKG 12-Lead -     DG Chest 2 View; Future -     CBC w/Diff; Future  Gastroesophageal reflux disease without esophagitis  Type 2 diabetes mellitus with complication, without long-term current use of insulin (HCC) -     Hemoglobin A1c;  Future -     Comprehensive metabolic panel; Future -     Urine Microalbumin w/creat. ratio; Future  Hyperlipidemia with target LDL less than 100 -     Lipid Profile; Future    GERD (gastroesophageal reflux disease) Stable No OTC med use at this time  Type II diabetes mellitus with manifestations Has not taken metformin x 1year. Repeat hgbA1c. Advised about need for low carb/low salt diet and regular exercise.  Hyperlipidemia with target LDL less than 100 Advised about need for healthy diet and regular exercise. Repeat lipid profile today.  SOB (shortness of breath) Normal ECG. Pending CXR  ECG Interpretation: Normal sinus rhythm. No ST interval or T wave abnormality.    Follow up: Return in about 6 months (around 11/23/2016) for DM and hyperlipidermia.  Alysia Penna, NP

## 2016-05-30 ENCOUNTER — Telehealth: Payer: Self-pay | Admitting: Internal Medicine

## 2016-05-30 NOTE — Telephone Encounter (Signed)
Pt called in and said that he needs the form that he left for his cpe on Friday but tomorrow.  He said that he has to turn it in by tomorrow .

## 2016-05-31 ENCOUNTER — Telehealth: Payer: Self-pay

## 2016-05-31 MED ORDER — METFORMIN HCL ER (MOD) 1000 MG PO TB24: 1000.0000 mg | ORAL_TABLET | Freq: Two times a day (BID) | ORAL | 3 refills | Status: DC

## 2016-05-31 NOTE — Progress Notes (Signed)
Resume metformin. Encourage daily exercise,and  low carb/low sugar diet. Return to office in 3months.

## 2016-05-31 NOTE — Telephone Encounter (Signed)
See other telephone note---patient advised to get xray performed today in order to complete form

## 2016-05-31 NOTE — Telephone Encounter (Signed)
Advised patient that he needs to have xray done before paper can be signed by [place]---patient had shortness of breath, and xray is needed to rule out tuberculosis---patient advised of xray dept hours and encouraged to come today to have time for test to result so that paper can be signed and returned to him hopefully by tomorrow--patient repeated back for understanding

## 2016-06-01 ENCOUNTER — Telehealth: Payer: Self-pay

## 2016-06-01 ENCOUNTER — Ambulatory Visit (INDEPENDENT_AMBULATORY_CARE_PROVIDER_SITE_OTHER)
Admission: RE | Admit: 2016-06-01 | Discharge: 2016-06-01 | Disposition: A | Discharge status: Home / Self Care | Payer: BLUE CROSS/BLUE SHIELD | Source: Ambulatory Visit | Attending: Nurse Practitioner | Admitting: Nurse Practitioner

## 2016-06-01 DIAGNOSIS — R0602 Shortness of breath: Secondary | ICD-10-CM

## 2016-06-01 MED ORDER — METFORMIN HCL 1000 MG PO TABS: 1000.0000 mg | ORAL_TABLET | Freq: Two times a day (BID) | ORAL | 3 refills | Status: DC

## 2016-06-01 NOTE — Telephone Encounter (Signed)
Pharmacy received Rx for Metformin Glumetza.  Preferred Rx Metformin is ER. Rx changed and resent to pharmacy

## 2016-06-02 NOTE — Progress Notes (Signed)
Normal results, see office note

## 2016-08-07 DIAGNOSIS — B348 Other viral infections of unspecified site: Secondary | ICD-10-CM | POA: Diagnosis not present

## 2016-08-31 ENCOUNTER — Ambulatory Visit: Payer: BLUE CROSS/BLUE SHIELD | Admitting: Nurse Practitioner

## 2016-11-20 ENCOUNTER — Encounter: Payer: Self-pay | Admitting: Internal Medicine

## 2016-11-20 ENCOUNTER — Ambulatory Visit (INDEPENDENT_AMBULATORY_CARE_PROVIDER_SITE_OTHER): Payer: BLUE CROSS/BLUE SHIELD | Admitting: Internal Medicine

## 2016-11-20 VITALS — BP 128/80 | HR 74 | Temp 98.2°F | Resp 16 | Ht 69.0 in | Wt 239.0 lb

## 2016-11-20 DIAGNOSIS — Z794 Long term (current) use of insulin: Secondary | ICD-10-CM

## 2016-11-20 DIAGNOSIS — E118 Type 2 diabetes mellitus with unspecified complications: Secondary | ICD-10-CM

## 2016-11-20 LAB — POCT GLYCOSYLATED HEMOGLOBIN (HGB A1C): HEMOGLOBIN A1C: 10.2

## 2016-11-20 MED ORDER — GLUCOSE BLOOD VI STRP: ORAL_STRIP | 11 refills | Status: DC

## 2016-11-20 MED ORDER — BASAGLAR KWIKPEN 100 UNIT/ML ~~LOC~~ SOPN: 30.0000 [IU] | PEN_INJECTOR | Freq: Every day | SUBCUTANEOUS | 11 refills | Status: DC

## 2016-11-20 MED ORDER — METFORMIN HCL 1000 MG PO TABS: 1000.0000 mg | ORAL_TABLET | Freq: Two times a day (BID) | ORAL | 1 refills | Status: DC

## 2016-11-20 MED ORDER — EXENATIDE ER 2 MG/0.85ML ~~LOC~~ AUIJ: 1.0000 | AUTO-INJECTOR | SUBCUTANEOUS | 3 refills | Status: DC

## 2016-11-20 MED ORDER — ACCU-CHEK GUIDE W/DEVICE KIT: 1.0000 | PACK | Freq: Three times a day (TID) | 0 refills | Status: DC

## 2016-11-20 NOTE — Patient Instructions (Signed)

## 2016-11-20 NOTE — Progress Notes (Signed)
Pre visit review using our clinic review tool, if applicable. No additional management support is needed unless otherwise documented below in the visit note. 

## 2016-11-20 NOTE — Progress Notes (Signed)
Subjective:  Patient ID: Lee Hunt, male    DOB: September 22, 1976  Age: 41 y.o. MRN: [id number]  CC: Diabetes   HPI Antonios Ostrow presents for Follow-up on diabetes. He is not compliant with metformin and does not monitor his blood sugar. He does complain of polys.  Outpatient Medications Prior to Visit  Medication Sig Dispense Refill  . metFORMIN (GLUCOPHAGE) 1000 MG tablet Take 1 tablet (1,000 mg total) by mouth 2 (two) times daily with a meal. (Patient not taking: Reported on 11/20/2016) 60 tablet 3   No facility-administered medications prior to visit.     ROS Review of Systems  Constitutional: Negative for appetite change, chills, diaphoresis, fatigue and unexpected weight change.  HENT: Negative for trouble swallowing and voice change.   Eyes: Negative for visual disturbance.  Respiratory: Negative for cough, chest tightness, shortness of breath, wheezing and stridor.   Cardiovascular: Negative for chest pain, palpitations and leg swelling.  Gastrointestinal: Negative for abdominal pain, constipation, diarrhea, nausea and vomiting.  Endocrine: Positive for polydipsia, polyphagia and polyuria. Negative for cold intolerance and heat intolerance.  Genitourinary: Negative.  Negative for difficulty urinating, dysuria, frequency, penile pain and penile swelling.  Musculoskeletal: Negative.  Negative for back pain and neck pain.  Skin: Negative.  Negative for color change and rash.  Neurological: Negative.  Negative for dizziness, syncope, weakness and numbness.  Hematological: Negative.   Psychiatric/Behavioral: Negative.     Objective:  BP 128/80 (BP Location: Left Arm, Patient Position: Sitting, Cuff Size: Large)   Pulse 74   Temp 98.2 F (36.8 C) (Oral)   Resp 16   Ht _0  (1.753 m)   Wt 239 lb (108.4 kg)   SpO2 98%   BMI 35.29 kg/m   BP Readings from Last 3 Encounters:  11/20/16 128/80  05/26/16 124/90  04/26/15 104/68    Wt Readings from Last 3 Encounters:    11/20/16 239 lb (108.4 kg)  05/26/16 243 lb 8 oz (110.5 kg)  04/26/15 240 lb (108.9 kg)    Physical Exam  Constitutional: He is oriented to person, place, and time. No distress.  HENT:  Mouth/Throat: Oropharynx is clear and moist. No oropharyngeal exudate.  Eyes: Conjunctivae are normal. Right eye exhibits no discharge. Left eye exhibits no discharge. No scleral icterus.  Neck: Normal range of motion. Neck supple. No JVD present. No tracheal deviation present. No thyromegaly present.  Cardiovascular: Normal rate, regular rhythm, normal heart sounds and intact distal pulses.  Exam reveals no gallop and no friction rub.   No murmur heard. Pulmonary/Chest: Effort normal and breath sounds normal. No stridor. No respiratory distress. He has no wheezes. He has no rales. He exhibits no tenderness.  Abdominal: Soft. Bowel sounds are normal. He exhibits no distension and no mass. There is no tenderness. There is no rebound and no guarding.  Musculoskeletal: Normal range of motion. He exhibits no edema, tenderness or deformity.  Lymphadenopathy:    He has no cervical adenopathy.  Neurological: He is oriented to person, place, and time.  Skin: Skin is warm and dry. No rash noted. He is not diaphoretic. No erythema. No pallor.  Vitals reviewed.   Lab Results  Component Value Date   WBC 6.7 05/26/2016   HGB 15.6 05/26/2016   HCT 45.3 05/26/2016   PLT 238.0 05/26/2016   GLUCOSE 340 (H) 05/26/2016   CHOL 162 05/26/2016   TRIG 192.0 (H) 05/26/2016   HDL 31.50 (L) 05/26/2016   LDLCALC 92 05/26/2016   ALT  25 05/26/2016   AST 13 05/26/2016   NA 136 05/26/2016   K 4.2 05/26/2016   CL 101 05/26/2016   CREATININE 1.11 05/26/2016   BUN 9 05/26/2016   CO2 31 05/26/2016   TSH 1.25 04/05/2015   HGBA1C 8.8 (H) 05/26/2016   MICROALBUR <0.7 05/26/2016    Dg Chest 2 View  Result Date: 06/01/2016 CLINICAL DATA:  Ex-smoker shortness of breath for 2 months. EXAM: CHEST  2 VIEW COMPARISON:  None.  FINDINGS: The heart size and mediastinal contours are within normal limits. Both lungs are clear. The visualized skeletal structures are unremarkable. IMPRESSION: Normal chest x-ray. Electronically Signed   By: Marijo Sanes M.D.   On: 06/01/2016 16:11    Assessment & Plan:   Eilan was seen today for diabetes.  Diagnoses and all orders for this visit:  Type 2 diabetes mellitus with complication, with long-term current use of insulin ([place])- his A1c is up to 10.2% and he is symptomatic with hyperglycemia. Will monitor his renal function today. Will start a basal insulin and restart metformin. I have also asked him to start using a GLP-1 agonist. He was given glucometers today to monitor his blood sugar and has been referred for diabetic education. -     Basic metabolic panel; Future -     Cancel: Hemoglobin A1c; Future -     Amb Referral to Nutrition and Diabetic E -     Exenatide ER (BYDUREON BCISE) 2 MG/0.85ML AUIJ; Inject 1 Act into the skin once a week. -     Insulin Glargine (BASAGLAR KWIKPEN) 100 UNIT/ML SOPN; Inject 0.3 mLs (30 Units total) into the skin at bedtime. -     metFORMIN (GLUCOPHAGE) 1000 MG tablet; Take 1 tablet (1,000 mg total) by mouth 2 (two) times daily with a meal. -     glucose blood (ACCU-CHEK GUIDE) test strip; Use TID -     Blood Glucose Monitoring Suppl (ACCU-CHEK GUIDE) w/Device KIT; 1 Act by Does not apply route 3 (three) times daily with meals.   I am having Mr. Schliep start on Exenatide ER, BASAGLAR KWIKPEN, glucose blood, and ACCU-CHEK GUIDE. I am also having him maintain his metFORMIN.  Meds ordered this encounter  Medications  . Exenatide ER (BYDUREON BCISE) 2 MG/0.85ML AUIJ    Sig: Inject 1 Act into the skin once a week.    Dispense:  12 pen    Refill:  3  . Insulin Glargine (BASAGLAR KWIKPEN) 100 UNIT/ML SOPN    Sig: Inject 0.3 mLs (30 Units total) into the skin at bedtime.    Dispense:  3 mL    Refill:  11  . metFORMIN (GLUCOPHAGE) 1000 MG tablet      Sig: Take 1 tablet (1,000 mg total) by mouth 2 (two) times daily with a meal.    Dispense:  180 tablet    Refill:  1  . glucose blood (ACCU-CHEK GUIDE) test strip    Sig: Use TID    Dispense:  100 each    Refill:  11  . Blood Glucose Monitoring Suppl (ACCU-CHEK GUIDE) w/Device KIT    Sig: 1 Act by Does not apply route 3 (three) times daily with meals.    Dispense:  2 kit    Refill:  0     Follow-up: No Follow-up on file.  Scarlette Calico, MD

## 2016-11-22 ENCOUNTER — Telehealth: Payer: Self-pay

## 2016-11-22 NOTE — Telephone Encounter (Signed)
PA Key: [id number]

## 2016-11-23 ENCOUNTER — Ambulatory Visit: Payer: BLUE CROSS/BLUE SHIELD | Admitting: Internal Medicine

## 2016-11-23 NOTE — Telephone Encounter (Signed)
PA for bydureon bcise was not approved.   Alternative requested to try and fail are Trulicity and Victoza

## 2016-11-26 ENCOUNTER — Other Ambulatory Visit: Payer: Self-pay | Admitting: Internal Medicine

## 2016-11-26 DIAGNOSIS — Z794 Long term (current) use of insulin: Secondary | ICD-10-CM

## 2016-11-26 DIAGNOSIS — E118 Type 2 diabetes mellitus with unspecified complications: Secondary | ICD-10-CM

## 2016-11-26 MED ORDER — DULAGLUTIDE 0.75 MG/0.5ML ~~LOC~~ SOAJ: 1.0000 | SUBCUTANEOUS | 1 refills | Status: DC

## 2016-11-26 NOTE — Telephone Encounter (Signed)
changed

## 2016-11-28 NOTE — Telephone Encounter (Signed)
Pt informed of change

## 2016-12-06 ENCOUNTER — Other Ambulatory Visit: Payer: Self-pay | Admitting: Internal Medicine

## 2016-12-06 DIAGNOSIS — E118 Type 2 diabetes mellitus with unspecified complications: Secondary | ICD-10-CM

## 2016-12-06 DIAGNOSIS — Z794 Long term (current) use of insulin: Secondary | ICD-10-CM

## 2016-12-12 ENCOUNTER — Encounter: Payer: BLUE CROSS/BLUE SHIELD | Attending: Internal Medicine | Admitting: *Deleted

## 2016-12-12 DIAGNOSIS — Z6834 Body mass index (BMI) 34.0-34.9, adult: Secondary | ICD-10-CM | POA: Diagnosis not present

## 2016-12-12 DIAGNOSIS — Z713 Dietary counseling and surveillance: Secondary | ICD-10-CM | POA: Insufficient documentation

## 2016-12-12 DIAGNOSIS — E118 Type 2 diabetes mellitus with unspecified complications: Secondary | ICD-10-CM | POA: Insufficient documentation

## 2016-12-12 DIAGNOSIS — Z794 Long term (current) use of insulin: Secondary | ICD-10-CM | POA: Insufficient documentation

## 2016-12-12 NOTE — Patient Instructions (Signed)
Plan:  Aim for 3-4 Carb Choices per meal (45-60 grams)   Aim for 0-2 Carbs per snack if hungry  Include protein in moderation with your meals and snacks Consider reading food labels for Total Carbohydrate of foods Continue with your activity level daily as tolerated Consider checking BG at alternate times per day  Continue taking medication Metformin as directed by MD

## 2016-12-20 NOTE — Progress Notes (Signed)
Diabetes Self-Management Education  Visit Type: First/Initial  Appt. Start Time: 1400 Appt. End Time: 1530  12/20/2016  [name], identified by name and date of birth, is a 41 y.o. male with a diagnosis of Diabetes: Type 2. He works as Veterinary surgeonealtor, irregular schedule. He states his FBG is running 80-120 mg/dl currently. He states he has run out of Bydureon about 2 weeks ago and BG are doing fine so far. He also states he stopped the Basaglar when he ran out, has not refilled that Rx either. He continues to take Metformin as prescribed. He states 3 incidences of hypoglycemia when on Basaglar.  ASSESSMENT  Height 5\' 9"  (1.753 m), weight 234 lb (106.1 kg). Body mass index is 34.56 kg/m.      Diabetes Self-Management Education - 12/12/16 1411      Visit Information   Visit Type First/Initial     Initial Visit   Diabetes Type Type 2   Are you currently following a meal plan? No   Are you taking your medications as prescribed? Yes   Date Diagnosed 2013     Health Coping   How would you rate your overall health? Good     Psychosocial Assessment   Patient Belief/Attitude about Diabetes Motivated to manage diabetes  was in denial prior to last MD visit   Self-care barriers None   Other persons present Patient   Patient Concerns Nutrition/Meal planning;Glycemic Control   Preferred Learning Style Auditory;Visual;Hands on   How often do you need to have someone help you when you read instructions, pamphlets, or other written materials from your doctor or pharmacy? 1 - Never   What is the last grade level you completed in school? some college     Pre-Education Assessment   Patient understands the diabetes disease and treatment process. Needs Instruction   Patient understands incorporating nutritional management into lifestyle. Needs Instruction   Patient undertands incorporating physical activity into lifestyle. Needs Instruction   Patient understands using medications safely.  Needs Instruction   Patient understands monitoring blood glucose, interpreting and using results Needs Review   Patient understands prevention, detection, and treatment of acute complications. Needs Instruction   Patient understands prevention, detection, and treatment of chronic complications. Needs Instruction   Patient understands how to develop strategies to address psychosocial issues. Needs Instruction   Patient understands how to develop strategies to promote health/change behavior. Needs Instruction     Complications   Last HgB A1C per patient/outside source 10.2 %   How often do you check your blood sugar? 1-2 times/day   Fasting Blood glucose range (mg/dL) [phone]   Number of hypoglycemic episodes per month 3   Can you tell when your blood sugar is low? Yes   What do you do if your blood sugar is low? jus ate some food   Have you had a dilated eye exam in the past 12 months? No   Have you had a dental exam in the past 12 months? No   Are you checking your feet? Yes   How many days per week are you checking your feet? 5     Dietary Intake   Breakfast cereal (Raisin bran)    Snack (morning) occasionally PNB or cheese and crackers    Lunch if at home, may not eat lunch OR sandwich and large fries if any OR sit down meal    Snack (afternoon) snack bag of popcorn   Dinner vegetables, meat, small serving of starch,  Snack (evening) occasionally more PNB crackers   Beverage(s) 1% milk, sweet tea, water     Exercise   Exercise Type Light (walking / raking leaves)   How many days per week to you exercise? 3   How many minutes per day do you exercise? 20   Total minutes per week of exercise 60     Patient Education   Previous Diabetes Education Yes (please comment)   Disease state  Definition of diabetes, type 1 and 2, and the diagnosis of diabetes   Nutrition management  Role of diet in the treatment of diabetes and the relationship between the three main macronutrients and  blood glucose level;Food label reading, portion sizes and measuring food.;Carbohydrate counting;Reviewed blood glucose goals for pre and post meals and how to evaluate the patients' food intake on their blood glucose level.   Physical activity and exercise  Role of exercise on diabetes management, blood pressure control and cardiac health.   Medications Reviewed patients medication for diabetes, action, purpose, timing of dose and side effects.   Monitoring Identified appropriate SMBG and/or A1C goals.   Acute complications Taught treatment of hypoglycemia - the 15 rule.   Chronic complications Relationship between chronic complications and blood glucose control   Psychosocial adjustment Worked with patient to identify barriers to care and solutions;Role of stress on diabetes     Individualized Goals (developed by patient)   Nutrition Follow meal plan discussed   Physical Activity Exercise 3-5 times per week   Medications take my medication as prescribed   Monitoring  test blood glucose pre and post meals as discussed     Post-Education Assessment   Patient understands the diabetes disease and treatment process. Demonstrates understanding / competency   Patient understands incorporating nutritional management into lifestyle. Demonstrates understanding / competency   Patient undertands incorporating physical activity into lifestyle. Demonstrates understanding / competency   Patient understands using medications safely. Demonstrates understanding / competency   Patient understands monitoring blood glucose, interpreting and using results Demonstrates understanding / competency   Patient understands prevention, detection, and treatment of acute complications. Demonstrates understanding / competency   Patient understands prevention, detection, and treatment of chronic complications. Demonstrates understanding / competency   Patient understands how to develop strategies to address psychosocial  issues. Demonstrates understanding / competency   Patient understands how to develop strategies to promote health/change behavior. Demonstrates understanding / competency     Outcomes   Expected Outcomes Demonstrated interest in learning. Expect positive outcomes   Future DMSE PRN   Program Status Completed      Individualized Plan for Diabetes Self-Management Training:   Learning Objective:  Patient will have a greater understanding of diabetes self-management. Patient education plan is to attend individual and/or group sessions per assessed needs and concerns.   Plan:   Patient Instructions  Plan:  Aim for 3-4 Carb Choices per meal (45-60 grams)   Aim for 0-2 Carbs per snack if hungry  Include protein in moderation with your meals and snacks Consider reading food labels for Total Carbohydrate of foods Continue with your activity level daily as tolerated Consider checking BG at alternate times per day  Continue taking medication Metformin as directed by MD  Expected Outcomes:  Demonstrated interest in learning. Expect positive outcomes  Education material provided: Living Well with Diabetes, A1C conversion sheet, Meal plan card and Carbohydrate counting sheet, Diabetes medication handouts  If problems or questions, patient to contact team via:  Phone and Email  Future DSME  appointment: PRN

## 2017-12-26 ENCOUNTER — Encounter: Payer: Self-pay | Admitting: Family

## 2017-12-26 ENCOUNTER — Ambulatory Visit: Payer: BLUE CROSS/BLUE SHIELD | Admitting: Family

## 2017-12-26 VITALS — BP 132/80 | HR 110 | Temp 98.6°F | Ht 69.0 in | Wt 219.0 lb

## 2017-12-26 DIAGNOSIS — J029 Acute pharyngitis, unspecified: Secondary | ICD-10-CM

## 2017-12-26 LAB — POCT RAPID STREP A (OFFICE): RAPID STREP A SCREEN: NEGATIVE

## 2017-12-26 MED ORDER — AMOXICILLIN-POT CLAVULANATE 875-125 MG PO TABS: 1.0000 | ORAL_TABLET | Freq: Two times a day (BID) | ORAL | 0 refills | Status: DC

## 2017-12-26 NOTE — Progress Notes (Signed)
Lee Hunt is a 42 y.o. male with the following history as recorded in [employer]:  Patient Active Problem List   Diagnosis Date Noted  . SOB (shortness of breath) 05/26/2016  . Hyperlipidemia with target LDL less than 100 03/25/2013  . Routine general medical examination at a health care facility 03/25/2013  . 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 Type II diabetes mellitus with manifestations ([place]) 10/27/2011  . GERD (gastroesophageal reflux disease) 10/27/2011    Current Outpatient Medications  Medication Sig Dispense Refill  . Blood Glucose Monitoring Suppl (ACCU-CHEK GUIDE) w/Device KIT 1 Act by Does not apply route 3 (three) times daily with meals. 2 kit 0  . Dulaglutide (TRULICITY) 7.82 UM/3.5TI SOPN Inject 1 Act into the skin once a week. 12 pen 1  . glucose blood (ACCU-CHEK GUIDE) test strip Use TID 100 each 11  . Insulin Glargine (BASAGLAR KWIKPEN) 100 UNIT/ML SOPN Inject 0.3 mLs (30 Units total) into the skin at bedtime. 3 mL 11  . metFORMIN (GLUCOPHAGE) 1000 MG tablet Take 1 tablet (1,000 mg total) by mouth 2 (two) times daily with a meal. 180 tablet 1  . amoxicillin-clavulanate (AUGMENTIN) 875-125 MG tablet Take 1 tablet by mouth 2 (two) times daily. 20 tablet 0   No current facility-administered medications for this visit.     Allergies: Patient has no known allergies.  Past Medical History:  Diagnosis Date  . Diabetes mellitus without complication ([place])   . GERD (gastroesophageal reflux disease)   . No pertinent past medical history     Past Surgical History:  Procedure Laterality Date  . NO PAST SURGERIES      Family History  Problem Relation Age of Onset  . Diabetes type II Mother   . Hypertension Mother   . Diabetes Mother   . Lung cancer Father   . Alcohol abuse Neg Hx   . Cancer Neg Hx   . COPD Neg Hx   . Depression Neg Hx   . Drug abuse Neg Hx   . Early death Neg Hx   . Heart disease Neg Hx   . Hyperlipidemia Neg Hx   . Kidney disease Neg Hx   . Stroke Neg  Hx     Social History   Tobacco Use  . Smoking status: Former Smoker    Types: Cigarettes    Last attempt to quit: 10/26/2006    Years since quitting: 11.1  . Smokeless tobacco: Never Used  Substance Use Topics  . Alcohol use: No    Subjective:  Patient presents with concerns for cough/ sore throat; + difficulty swallowing; + thick mucus; + fever; symptoms x 5 days; has small children at home but no known strep;   Has not seen his PCP in over a year for diabetes follow-up; admits he "is not doing what he is supposed to do."    Objective:  Vitals:   12/26/17 1402  BP: 132/80  Pulse: (!) 110  Temp: 98.6 F (37 C)  TempSrc: Oral  SpO2: 97%  Weight: 219 lb 0.6 oz (99.4 kg)  Height: '5\' 9"'$  (1.753 m)    General: Well developed, well nourished, in no acute distress  Skin : Warm and dry.  Head: Normocephalic and atraumatic  Eyes: Sclera and conjunctiva clear; pupils round and reactive to light; extraocular movements intact  Ears: External normal; canals clear; cerumen impaction noted bilaterally Oropharynx: Pink, supple. No suspicious lesions; limited exam as patient is unable to tolerated tongue depressor Neck: Supple without thyromegaly, bilateral cervical adenopathy  Lungs: Respirations unlabored; clear to auscultation bilaterally without wheeze, rales, rhonchi  CVS exam: normal rate and regular rhythm.  Neurologic: Alert and oriented; speech intact; face symmetrical; moves all extremities well; CNII-XII intact without focal deficit   Assessment:  1. Sore throat     Plan:  Rapid strep negative in the office; based on symptoms in office, will go ahead and treat; Rx for Augmentin 875 mg bid x 10 days; change toothbrush; follow up if swelling has not resolved within 24-48 hours;   Stressed need to follow up with his PCP for diabetes follow-up.  Return in about 2 weeks (around 01/09/2018) for Dr. Ronnald Ramp for diabetes follow-up.  Orders Placed This Encounter  Procedures  . POCT  rapid strep A    Requested Prescriptions   Signed Prescriptions Disp Refills  . amoxicillin-clavulanate (AUGMENTIN) 875-125 MG tablet 20 tablet 0    Sig: Take 1 tablet by mouth 2 (two) times daily.

## 2018-01-08 ENCOUNTER — Ambulatory Visit: Payer: BLUE CROSS/BLUE SHIELD | Admitting: Internal Medicine

## 2018-03-26 ENCOUNTER — Other Ambulatory Visit (INDEPENDENT_AMBULATORY_CARE_PROVIDER_SITE_OTHER): Payer: BLUE CROSS/BLUE SHIELD

## 2018-03-26 ENCOUNTER — Ambulatory Visit: Payer: BLUE CROSS/BLUE SHIELD | Admitting: Internal Medicine

## 2018-03-26 ENCOUNTER — Encounter: Payer: BLUE CROSS/BLUE SHIELD | Admitting: Internal Medicine

## 2018-03-26 ENCOUNTER — Encounter: Payer: Self-pay | Admitting: Internal Medicine

## 2018-03-26 VITALS — BP 138/96 | HR 62 | Temp 98.5°F | Resp 16 | Ht 69.0 in | Wt 229.5 lb

## 2018-03-26 DIAGNOSIS — I10 Essential (primary) hypertension: Secondary | ICD-10-CM

## 2018-03-26 DIAGNOSIS — Z794 Long term (current) use of insulin: Secondary | ICD-10-CM

## 2018-03-26 DIAGNOSIS — L659 Nonscarring hair loss, unspecified: Secondary | ICD-10-CM | POA: Insufficient documentation

## 2018-03-26 DIAGNOSIS — K219 Gastro-esophageal reflux disease without esophagitis: Secondary | ICD-10-CM

## 2018-03-26 DIAGNOSIS — E559 Vitamin D deficiency, unspecified: Secondary | ICD-10-CM | POA: Diagnosis not present

## 2018-03-26 DIAGNOSIS — Z0000 Encounter for general adult medical examination without abnormal findings: Secondary | ICD-10-CM

## 2018-03-26 DIAGNOSIS — E785 Hyperlipidemia, unspecified: Secondary | ICD-10-CM | POA: Diagnosis not present

## 2018-03-26 DIAGNOSIS — R0683 Snoring: Secondary | ICD-10-CM | POA: Diagnosis not present

## 2018-03-26 DIAGNOSIS — E118 Type 2 diabetes mellitus with unspecified complications: Secondary | ICD-10-CM | POA: Diagnosis not present

## 2018-03-26 DIAGNOSIS — R079 Chest pain, unspecified: Secondary | ICD-10-CM

## 2018-03-26 LAB — CBC WITH DIFFERENTIAL/PLATELET
BASOS ABS: 0 10*3/uL (ref 0.0–0.1)
BASOS PCT: 0.6 % (ref 0.0–3.0)
Eosinophils Absolute: 0.1 10*3/uL (ref 0.0–0.7)
Eosinophils Relative: 1.1 % (ref 0.0–5.0)
HCT: 44 % (ref 39.0–52.0)
Hemoglobin: 15.3 g/dL (ref 13.0–17.0)
LYMPHS ABS: 1.9 10*3/uL (ref 0.7–4.0)
Lymphocytes Relative: 28 % (ref 12.0–46.0)
MCHC: 34.7 g/dL (ref 30.0–36.0)
MCV: 85.3 fl (ref 78.0–100.0)
MONOS PCT: 6.6 % (ref 3.0–12.0)
Monocytes Absolute: 0.4 10*3/uL (ref 0.1–1.0)
NEUTROS ABS: 4.3 10*3/uL (ref 1.4–7.7)
NEUTROS PCT: 63.7 % (ref 43.0–77.0)
PLATELETS: 235 10*3/uL (ref 150.0–400.0)
RBC: 5.16 Mil/uL (ref 4.22–5.81)
RDW: 13.7 % (ref 11.5–15.5)
WBC: 6.8 10*3/uL (ref 4.0–10.5)

## 2018-03-26 LAB — MICROALBUMIN / CREATININE URINE RATIO
CREATININE, U: 53.4 mg/dL
Microalb Creat Ratio: 1.3 mg/g (ref 0.0–30.0)
Microalb, Ur: <0.7 mg/dL (ref 0.0–1.9)

## 2018-03-26 LAB — COMPREHENSIVE METABOLIC PANEL
ALT: 23 U/L (ref 0–53)
AST: 15 U/L (ref 0–37)
Albumin: 4.3 g/dL (ref 3.5–5.2)
Alkaline Phosphatase: 59 U/L (ref 39–117)
BUN: 7 mg/dL (ref 6–23)
CALCIUM: 9.2 mg/dL (ref 8.4–10.5)
CHLORIDE: 102 meq/L (ref 96–112)
CO2: 28 meq/L (ref 19–32)
CREATININE: 0.81 mg/dL (ref 0.40–1.50)
GFR: 134.7 mL/min (ref >60.00)
Glucose, Bld: 112 mg/dL — ABNORMAL HIGH (ref 70–99)
Potassium: 3.9 mEq/L (ref 3.5–5.1)
Sodium: 137 mEq/L (ref 135–145)
Total Bilirubin: 0.6 mg/dL (ref 0.2–1.2)
Total Protein: 7.1 g/dL (ref 6.0–8.3)

## 2018-03-26 LAB — LIPID PANEL
CHOL/HDL RATIO: 4
Cholesterol: 160 mg/dL (ref 0–200)
HDL: 37.1 mg/dL — AB (ref >39.00)
LDL Cholesterol: 98 mg/dL (ref 0–99)
NonHDL: 122.45
TRIGLYCERIDES: 121 mg/dL (ref 0.0–149.0)
VLDL: 24.2 mg/dL (ref 0.0–40.0)

## 2018-03-26 LAB — URINALYSIS, ROUTINE W REFLEX MICROSCOPIC
Bilirubin Urine: NEGATIVE
HGB URINE DIPSTICK: NEGATIVE
Ketones, ur: NEGATIVE
Leukocytes, UA: NEGATIVE
NITRITE: NEGATIVE
Specific Gravity, Urine: <=1.005 — AB (ref 1.000–1.030)
Total Protein, Urine: NEGATIVE
Urine Glucose: NEGATIVE
Urobilinogen, UA: 0.2 (ref 0.0–1.0)
pH: 7 (ref 5.0–8.0)

## 2018-03-26 LAB — POCT GLUCOSE (DEVICE FOR HOME USE): GLUCOSE FASTING, POC: 116 mg/dL — AB (ref 70–99)

## 2018-03-26 LAB — POCT GLYCOSYLATED HEMOGLOBIN (HGB A1C): HEMOGLOBIN A1C: 7.4 % — AB (ref 4.0–5.6)

## 2018-03-26 LAB — TSH: TSH: 1.99 u[IU]/mL (ref 0.35–4.50)

## 2018-03-26 LAB — HM DIABETES FOOT EXAM

## 2018-03-26 LAB — PSA: PSA: 0.25 ng/mL (ref 0.10–4.00)

## 2018-03-26 LAB — VITAMIN D 25 HYDROXY (VIT D DEFICIENCY, FRACTURES): VITD: 8.64 ng/mL — ABNORMAL LOW (ref 30.00–100.00)

## 2018-03-26 MED ORDER — CHOLECALCIFEROL 1.25 MG (50000 UT) PO CAPS: 50000.0000 [IU] | ORAL_CAPSULE | ORAL | 1 refills | Status: DC

## 2018-03-26 MED ORDER — METFORMIN HCL 1000 MG PO TABS: 1000.0000 mg | ORAL_TABLET | Freq: Two times a day (BID) | ORAL | 1 refills | Status: DC

## 2018-03-26 MED ORDER — AZILSARTAN-CHLORTHALIDONE 40-12.5 MG PO TABS: 1.0000 | ORAL_TABLET | Freq: Every day | ORAL | 0 refills | Status: DC

## 2018-03-26 MED ORDER — ROSUVASTATIN CALCIUM 5 MG PO TABS: 5.0000 mg | ORAL_TABLET | Freq: Every day | ORAL | 1 refills | Status: DC

## 2018-03-26 NOTE — Patient Instructions (Signed)

## 2018-03-26 NOTE — Progress Notes (Signed)
Subjective:  Patient ID: Lee Hunt, male    DOB: Feb 23, 1976  Age: 42 y.o. MRN: [id number]  CC: Annual Exam; Diabetes; and Hyperlipidemia   HPI Britney Captain presents for a CPX.  He complains for several months he has noticed a spot of hair loss on his left posterior scalp. The area is otherwise asymptomatic.  He also complains of chronic shortness of breath and chest pain that occurs with rest.  He describes the chest pain as an achy sensation.  He has no exertional symptoms.  Additionally, he complains of heavy snoring, difficulty with sleeping, and weight gain.  He is monitoring his blood sugars and they are usually in the 150-170 range.  He is not currently taking any meds for diabetes.  He denies polys.  Outpatient Medications Prior to Visit  Medication Sig Dispense Refill  . Blood Glucose Monitoring Suppl (ACCU-CHEK GUIDE) w/Device KIT 1 Act by Does not apply route 3 (three) times daily with meals. 2 kit 0  . glucose blood (ACCU-CHEK GUIDE) test strip Use TID 100 each 11  . Dulaglutide (TRULICITY) 4.53 MI/6.8EH SOPN Inject 1 Act into the skin once a week. 12 pen 1  . Insulin Glargine (BASAGLAR KWIKPEN) 100 UNIT/ML SOPN Inject 0.3 mLs (30 Units total) into the skin at bedtime. 3 mL 11  . metFORMIN (GLUCOPHAGE) 1000 MG tablet Take 1 tablet (1,000 mg total) by mouth 2 (two) times daily with a meal. 180 tablet 1  . amoxicillin-clavulanate (AUGMENTIN) 875-125 MG tablet Take 1 tablet by mouth 2 (two) times daily. 20 tablet 0   No facility-administered medications prior to visit.     ROS Review of Systems  Constitutional: Negative.  Negative for appetite change, diaphoresis and fatigue.  HENT: Negative.  Negative for trouble swallowing and voice change.   Eyes: Negative.   Respiratory: Positive for shortness of breath. Negative for apnea, cough, chest tightness, wheezing and stridor.        +heavy snoring  Cardiovascular: Positive for chest pain. Negative for palpitations and  leg swelling.  Gastrointestinal: Negative.  Negative for abdominal pain, constipation, diarrhea, nausea and vomiting.  Endocrine: Negative.  Negative for polydipsia, polyphagia and polyuria.  Genitourinary: Negative.  Negative for difficulty urinating, dysuria, penile swelling, scrotal swelling, testicular pain and urgency.  Musculoskeletal: Negative.  Negative for arthralgias and myalgias.  Skin: Negative.  Negative for color change and pallor.  Allergic/Immunologic: Negative.   Neurological: Negative.  Negative for dizziness, weakness and light-headedness.  Hematological: Negative for adenopathy. Does not bruise/bleed easily.  Psychiatric/Behavioral: Positive for sleep disturbance. Negative for behavioral problems, decreased concentration, dysphoric mood and suicidal ideas. The patient is not nervous/anxious.     Objective:  BP (!) 138/96 (BP Location: Left Arm, Patient Position: Sitting, Cuff Size: Large)   Pulse 62   Temp 98.5 F (36.9 C) (Oral)   Resp 16   Ht '5\' 9"'$  (1.753 m)   Wt 229 lb 8 oz (104.1 kg)   SpO2 98%   BMI 33.89 kg/m   BP Readings from Last 3 Encounters:  03/26/18 (!) 138/96  12/26/17 132/80  11/20/16 128/80    Wt Readings from Last 3 Encounters:  03/26/18 229 lb 8 oz (104.1 kg)  12/26/17 219 lb 0.6 oz (99.4 kg)  12/12/16 234 lb (106.1 kg)    Physical Exam  Constitutional: No distress.  HENT:  Head:    Mouth/Throat: Oropharynx is clear and moist. No oropharyngeal exudate.  Eyes: Conjunctivae are normal. No scleral icterus.  Neck: Normal range  of motion. Neck supple. No JVD present. No thyromegaly present.  Cardiovascular: Normal rate, regular rhythm and normal heart sounds.  No murmur heard. EKG ---  Sinus  Bradycardia  WITHIN NORMAL LIMITS  Pulmonary/Chest: Effort normal and breath sounds normal. He has no decreased breath sounds. He has no wheezes. He has no rhonchi. He has no rales.  Abdominal: Soft. Normal appearance and bowel sounds are  normal. He exhibits no mass. There is no hepatosplenomegaly. There is no tenderness. No hernia. Hernia confirmed negative in the right inguinal area and confirmed negative in the left inguinal area.  Genitourinary: Rectum normal, prostate normal, testes normal and penis normal. Rectal exam shows no external hemorrhoid, no internal hemorrhoid, no fissure, no mass, no tenderness, anal tone normal and guaiac negative stool. Prostate is not enlarged and not tender. Right testis shows no mass, no swelling and no tenderness. Right testis is descended. Left testis shows no mass, no swelling and no tenderness. Left testis is descended. Circumcised. No penile erythema or penile tenderness. No discharge found.  Lymphadenopathy:    He has no cervical adenopathy. No inguinal adenopathy noted on the right or left side.  Skin: He is not diaphoretic.  Vitals reviewed.   Lab Results  Component Value Date   WBC 6.8 03/26/2018   HGB 15.3 03/26/2018   HCT 44.0 03/26/2018   PLT 235.0 03/26/2018   GLUCOSE 112 (H) 03/26/2018   CHOL 160 03/26/2018   TRIG 121.0 03/26/2018   HDL 37.10 (L) 03/26/2018   LDLCALC 98 03/26/2018   ALT 23 03/26/2018   AST 15 03/26/2018   NA 137 03/26/2018   K 3.9 03/26/2018   CL 102 03/26/2018   CREATININE 0.81 03/26/2018   BUN 7 03/26/2018   CO2 28 03/26/2018   TSH 1.99 03/26/2018   PSA 0.25 03/26/2018   HGBA1C 7.4 (A) 03/26/2018   MICROALBUR <0.7 03/26/2018    Dg Chest 2 View  Result Date: 06/01/2016 CLINICAL DATA:  Ex-smoker shortness of breath for 2 months. EXAM: CHEST  2 VIEW COMPARISON:  None. FINDINGS: The heart size and mediastinal contours are within normal limits. Both lungs are clear. The visualized skeletal structures are unremarkable. IMPRESSION: Normal chest x-ray. Electronically Signed   By: Marijo Sanes M.D.   On: 06/01/2016 16:11    Assessment & Plan:   Gaetan was seen today for annual exam, diabetes and hyperlipidemia.  Diagnoses and all orders for this  visit:  Type 2 diabetes mellitus with complication, with long-term current use of insulin ([facility])-his A1c is at 7.4%.  His blood sugars are not quite adequately well controlled.  I have asked him to start taking metformin.  He will also start an ARB for renal protection. -     Microalbumin / creatinine urine ratio; Future -     Comprehensive metabolic panel; Future -     Urinalysis, Routine w reflex microscopic; Future -     Cancel: Hemoglobin A1c; Future -     Hepatitis A antibody, total; Future -     Hepatitis B core antibody, total; Future -     Hepatitis B surface antibody; Future -     Ambulatory referral to Ophthalmology -     POCT glycosylated hemoglobin (Hb A1C) -     POCT Glucose (Device for Home Use) -     metFORMIN (GLUCOPHAGE) 1000 MG tablet; Take 1 tablet (1,000 mg total) by mouth 2 (two) times daily with a meal. -     Azilsartan-Chlorthalidone (EDARBYCLOR) 40-12.5  MG TABS; Take 1 tablet by mouth daily.  Gastroesophageal reflux disease without esophagitis- He is asymptomatic with respect to this and no complications identified today. -     CBC with Differential/Platelet; Future  Hyperlipidemia with target LDL less than 100 -     Comprehensive metabolic panel; Future -     TSH; Future  Routine general medical examination at a health care facility- Exam completed, labs reviewed, vaccines reviewed, patient education material was given. -     Lipid panel; Future -     PSA; Future -     HIV antibody; Future  Chest pain at rest- His chest pain is chronic and is not consistent with angina and his EKG is normal.  This is a chronic benign symptom for him. -     EKG 12-Lead  Essential hypertension- His blood pressure is not adequately well controlled.  I will treat the vitamin D deficiency.  Otherwise his labs are negative for secondary causes or endorgan damage.  I have asked him to start the combination of an ARB and thiazide diuretic. -     VITAMIN D 25 Hydroxy (Vit-D Deficiency,  Fractures); Future -     Azilsartan-Chlorthalidone (EDARBYCLOR) 40-12.5 MG TABS; Take 1 tablet by mouth daily.  Snoring -     Ambulatory referral to Sleep Studies  Alopecia -     Ambulatory referral to Dermatology  Dyslipidemia, goal LDL below 70-I have asked him to start taking a statin for CV risk reduction. -     rosuvastatin (CRESTOR) 5 MG tablet; Take 1 tablet (5 mg total) by mouth daily.  Vitamin D deficiency -     Cholecalciferol 50000 units capsule; Take 1 capsule (50,000 Units total) by mouth once a week.   I have discontinued Dailan Taddei's [facility], Dulaglutide, and amoxicillin-clavulanate. I am also having him start on Azilsartan-Chlorthalidone, Cholecalciferol, and rosuvastatin. Additionally, I am having him maintain his glucose blood, ACCU-CHEK GUIDE, and metFORMIN.  Meds ordered this encounter  Medications  . metFORMIN (GLUCOPHAGE) 1000 MG tablet    Sig: Take 1 tablet (1,000 mg total) by mouth 2 (two) times daily with a meal.    Dispense:  180 tablet    Refill:  1  . Azilsartan-Chlorthalidone (EDARBYCLOR) 40-12.5 MG TABS    Sig: Take 1 tablet by mouth daily.    Dispense:  90 tablet    Refill:  0  . Cholecalciferol 50000 units capsule    Sig: Take 1 capsule (50,000 Units total) by mouth once a week.    Dispense:  12 capsule    Refill:  1  . rosuvastatin (CRESTOR) 5 MG tablet    Sig: Take 1 tablet (5 mg total) by mouth daily.    Dispense:  90 tablet    Refill:  1     Follow-up: Return in about 2 months (around 05/27/2018).  Scarlette Calico, MD

## 2018-03-27 ENCOUNTER — Encounter: Payer: Self-pay | Admitting: Internal Medicine

## 2018-03-27 LAB — HIV ANTIBODY (ROUTINE TESTING W REFLEX): HIV: NONREACTIVE

## 2018-03-27 LAB — HEPATITIS A ANTIBODY, TOTAL: Hepatitis A AB,Total: NONREACTIVE

## 2018-03-27 LAB — HEPATITIS B CORE ANTIBODY, TOTAL: Hep B Core Total Ab: NONREACTIVE

## 2018-03-27 LAB — HEPATITIS B SURFACE ANTIBODY,QUALITATIVE: HEP B S AB: NONREACTIVE

## 2018-04-11 ENCOUNTER — Other Ambulatory Visit: Payer: Self-pay | Admitting: Internal Medicine

## 2018-04-11 ENCOUNTER — Telehealth: Payer: Self-pay

## 2018-04-11 DIAGNOSIS — I10 Essential (primary) hypertension: Secondary | ICD-10-CM

## 2018-04-11 DIAGNOSIS — Z794 Long term (current) use of insulin: Secondary | ICD-10-CM

## 2018-04-11 DIAGNOSIS — E118 Type 2 diabetes mellitus with unspecified complications: Secondary | ICD-10-CM

## 2018-04-11 MED ORDER — AZILSARTAN-CHLORTHALIDONE 40-12.5 MG PO TABS: 1.0000 | ORAL_TABLET | Freq: Every day | ORAL | 0 refills | Status: DC

## 2018-04-11 NOTE — Telephone Encounter (Signed)
Key: [id number]

## 2018-04-12 ENCOUNTER — Other Ambulatory Visit: Payer: Self-pay | Admitting: Internal Medicine

## 2018-04-12 DIAGNOSIS — I10 Essential (primary) hypertension: Secondary | ICD-10-CM

## 2018-04-12 DIAGNOSIS — E118 Type 2 diabetes mellitus with unspecified complications: Secondary | ICD-10-CM

## 2018-04-12 MED ORDER — OLMESARTAN MEDOXOMIL-HCTZ 40-12.5 MG PO TABS: 1.0000 | ORAL_TABLET | Freq: Every day | ORAL | 1 refills | Status: DC

## 2018-04-12 NOTE — Telephone Encounter (Signed)
Changed RX sent 

## 2018-04-12 NOTE — Telephone Encounter (Signed)
PA was denied.   Alternatives to try and fail: olmesartan HCTZ, telmisartan HCTZ, losartan HCTZ  Please advise of alternative if appropriate

## 2018-04-15 NOTE — Telephone Encounter (Signed)
Contacted pt and informed of same.  

## 2018-05-28 ENCOUNTER — Institutional Professional Consult (permissible substitution): Payer: BLUE CROSS/BLUE SHIELD | Admitting: Neurology

## 2018-07-16 DIAGNOSIS — H524 Presbyopia: Secondary | ICD-10-CM | POA: Diagnosis not present

## 2018-07-16 DIAGNOSIS — H52203 Unspecified astigmatism, bilateral: Secondary | ICD-10-CM | POA: Diagnosis not present

## 2018-07-16 DIAGNOSIS — H5213 Myopia, bilateral: Secondary | ICD-10-CM | POA: Diagnosis not present

## 2018-07-16 DIAGNOSIS — E119 Type 2 diabetes mellitus without complications: Secondary | ICD-10-CM | POA: Diagnosis not present

## 2018-07-16 LAB — HM DIABETES EYE EXAM

## 2018-07-19 ENCOUNTER — Encounter: Payer: Self-pay | Admitting: Internal Medicine

## 2018-08-06 ENCOUNTER — Ambulatory Visit: Payer: BLUE CROSS/BLUE SHIELD | Admitting: Internal Medicine

## 2018-10-01 ENCOUNTER — Ambulatory Visit: Payer: BLUE CROSS/BLUE SHIELD | Admitting: Internal Medicine

## 2018-12-11 ENCOUNTER — Other Ambulatory Visit (INDEPENDENT_AMBULATORY_CARE_PROVIDER_SITE_OTHER): Payer: Self-pay

## 2018-12-11 ENCOUNTER — Other Ambulatory Visit: Payer: Self-pay

## 2018-12-11 ENCOUNTER — Encounter: Payer: Self-pay | Admitting: Internal Medicine

## 2018-12-11 ENCOUNTER — Ambulatory Visit (INDEPENDENT_AMBULATORY_CARE_PROVIDER_SITE_OTHER): Payer: Self-pay | Admitting: Internal Medicine

## 2018-12-11 VITALS — BP 144/92 | HR 68 | Temp 98.7°F | Resp 16 | Ht 69.0 in | Wt 230.0 lb

## 2018-12-11 DIAGNOSIS — E118 Type 2 diabetes mellitus with unspecified complications: Secondary | ICD-10-CM

## 2018-12-11 DIAGNOSIS — I10 Essential (primary) hypertension: Secondary | ICD-10-CM

## 2018-12-11 DIAGNOSIS — Z794 Long term (current) use of insulin: Secondary | ICD-10-CM

## 2018-12-11 LAB — BASIC METABOLIC PANEL
BUN: 8 mg/dL (ref 6–23)
CHLORIDE: 102 meq/L (ref 96–112)
CO2: 29 mEq/L (ref 19–32)
Calcium: 9 mg/dL (ref 8.4–10.5)
Creatinine, Ser: 0.9 mg/dL (ref 0.40–1.50)
GFR: 111.84 mL/min (ref >60.00)
Glucose, Bld: 116 mg/dL — ABNORMAL HIGH (ref 70–99)
POTASSIUM: 3.9 meq/L (ref 3.5–5.1)
SODIUM: 137 meq/L (ref 135–145)

## 2018-12-11 LAB — HEMOGLOBIN A1C: Hgb A1c MFr Bld: 8.9 % — ABNORMAL HIGH (ref 4.6–6.5)

## 2018-12-11 MED ORDER — METFORMIN HCL 1000 MG PO TABS: 1000.0000 mg | ORAL_TABLET | Freq: Two times a day (BID) | ORAL | 1 refills | Status: DC

## 2018-12-11 NOTE — Progress Notes (Signed)
Subjective:  Patient ID: Lee Hunt, male    DOB: Feb 09, 1976  Age: 43 y.o. MRN: [id number]  CC: Hypertension and Diabetes   HPI Lee Hunt presents for f/up - He feels well today and offers no complaints.  He tells me he is only only taking 1 tablet each day and he thinks it is the statin.  He does not monitor his blood pressure or his blood sugar.  He does not think he is taking metformin or olmesartan/HCTZ.  Outpatient Medications Prior to Visit  Medication Sig Dispense Refill  . Cholecalciferol 50000 units capsule Take 1 capsule (50,000 Units total) by mouth once a week. 12 capsule 1  . Blood Glucose Monitoring Suppl (ACCU-CHEK GUIDE) w/Device KIT 1 Act by Does not apply route 3 (three) times daily with meals. (Patient not taking: Reported on 12/11/2018) 2 kit 0  . glucose blood (ACCU-CHEK GUIDE) test strip Use TID (Patient not taking: Reported on 12/11/2018) 100 each 11  . rosuvastatin (CRESTOR) 5 MG tablet Take 1 tablet (5 mg total) by mouth daily. 90 tablet 1  . metFORMIN (GLUCOPHAGE) 1000 MG tablet Take 1 tablet (1,000 mg total) by mouth 2 (two) times daily with a meal. (Patient not taking: Reported on 12/11/2018) 180 tablet 1  . olmesartan-hydrochlorothiazide (BENICAR HCT) 40-12.5 MG tablet Take 1 tablet by mouth daily. 90 tablet 1   No facility-administered medications prior to visit.     ROS Review of Systems  Constitutional: Negative for diaphoresis and fatigue.  HENT: Negative.   Eyes: Negative for visual disturbance.  Respiratory: Negative for cough, chest tightness, shortness of breath and wheezing.   Cardiovascular: Negative for chest pain, palpitations and leg swelling.  Gastrointestinal: Negative for abdominal pain, constipation, diarrhea, nausea and vomiting.  Endocrine: Negative for polydipsia, polyphagia and polyuria.  Genitourinary: Negative.  Negative for difficulty urinating.  Musculoskeletal: Negative for arthralgias and myalgias.  Skin: Negative.    Neurological: Negative.  Negative for dizziness, weakness and light-headedness.  Hematological: Negative for adenopathy. Does not bruise/bleed easily.  Psychiatric/Behavioral: Negative.     Objective:  BP (!) 144/92 (BP Location: Left Arm, Patient Position: Sitting, Cuff Size: Large)   Pulse 68   Temp 98.7 F (37.1 C) (Oral)   Resp 16   Ht _0  (1.753 m)   Wt 230 lb (104.3 kg)   SpO2 98%   BMI 33.97 kg/m   BP Readings from Last 3 Encounters:  12/11/18 (!) 144/92  03/26/18 (!) 138/96  12/26/17 132/80    Wt Readings from Last 3 Encounters:  12/11/18 230 lb (104.3 kg)  03/26/18 229 lb 8 oz (104.1 kg)  12/26/17 219 lb 0.6 oz (99.4 kg)    Physical Exam Constitutional:      Appearance: He is obese. He is not ill-appearing or diaphoretic.  HENT:     Nose: Nose normal.     Mouth/Throat:     Mouth: Mucous membranes are moist.     Pharynx: Oropharynx is clear. No oropharyngeal exudate.  Eyes:     General: No scleral icterus.    Conjunctiva/sclera: Conjunctivae normal.  Neck:     Musculoskeletal: Normal range of motion and neck supple. No muscular tenderness.  Cardiovascular:     Rate and Rhythm: Normal rate and regular rhythm.     Heart sounds: No murmur.  Pulmonary:     Effort: Pulmonary effort is normal.     Breath sounds: No stridor. No wheezing or rhonchi.  Abdominal:     General:  Bowel sounds are normal.     Palpations: There is no hepatomegaly, splenomegaly or mass.     Tenderness: There is no abdominal tenderness.  Musculoskeletal: Normal range of motion.        General: No swelling.     Right lower leg: No edema.     Left lower leg: No edema.  Lymphadenopathy:     Cervical: No cervical adenopathy.  Skin:    General: Skin is warm and dry.     Coloration: Skin is not pale.  Neurological:     General: No focal deficit present.     Mental Status: He is oriented to person, place, and time. Mental status is at baseline.     Lab Results  Component Value  Date   WBC 6.8 03/26/2018   HGB 15.3 03/26/2018   HCT 44.0 03/26/2018   PLT 235.0 03/26/2018   GLUCOSE 116 (H) 12/11/2018   CHOL 160 03/26/2018   TRIG 121.0 03/26/2018   HDL 37.10 (L) 03/26/2018   LDLCALC 98 03/26/2018   ALT 23 03/26/2018   AST 15 03/26/2018   NA 137 12/11/2018   K 3.9 12/11/2018   CL 102 12/11/2018   CREATININE 0.90 12/11/2018   BUN 8 12/11/2018   CO2 29 12/11/2018   TSH 1.99 03/26/2018   PSA 0.25 03/26/2018   HGBA1C 8.9 (H) 12/11/2018   MICROALBUR <0.7 03/26/2018    Dg Chest 2 View  Result Date: 06/01/2016 CLINICAL DATA:  Ex-smoker shortness of breath for 2 months. EXAM: CHEST  2 VIEW COMPARISON:  None. FINDINGS: The heart size and mediastinal contours are within normal limits. Both lungs are clear. The visualized skeletal structures are unremarkable. IMPRESSION: Normal chest x-ray. Electronically Signed   By: Marijo Sanes M.D.   On: 06/01/2016 16:11    Assessment & Plan:   Lee Hunt was seen today for hypertension and diabetes.  Diagnoses and all orders for this visit:  Type II diabetes mellitus with manifestations ([place])- His A1c is at 8.9%.  His blood sugars are not adequately well controlled.  I have asked him to restart metformin. -     Basic metabolic panel; Future -     Hemoglobin A1c; Future -     metFORMIN (GLUCOPHAGE) 1000 MG tablet; Take 1 tablet (1,000 mg total) by mouth 2 (two) times daily with a meal.  Essential hypertension- His blood pressure is not adequately well controlled.  I have asked him to restart the ARB/thiazide diuretic combination. -     Basic metabolic panel; Future  Type 2 diabetes mellitus with complication, with long-term current use of insulin ([facility]) -     metFORMIN (GLUCOPHAGE) 1000 MG tablet; Take 1 tablet (1,000 mg total) by mouth 2 (two) times daily with a meal.   I am having Lee Hunt maintain his glucose blood, Accu-Chek Guide, Cholecalciferol, rosuvastatin, metFORMIN, and olmesartan-hydrochlorothiazide.   Meds ordered this encounter  Medications  . metFORMIN (GLUCOPHAGE) 1000 MG tablet    Sig: Take 1 tablet (1,000 mg total) by mouth 2 (two) times daily with a meal.    Dispense:  180 tablet    Refill:  1  . olmesartan-hydrochlorothiazide (BENICAR HCT) 40-12.5 MG tablet    Sig: Take 1 tablet by mouth daily.    Dispense:  90 tablet    Refill:  1     Follow-up: Return in about 6 months (around 06/13/2019).  Scarlette Calico, MD

## 2018-12-11 NOTE — Patient Instructions (Signed)
Type 2 Diabetes Mellitus, Diagnosis, Adult Type 2 diabetes (type 2 diabetes mellitus) is a long-term (chronic) disease. In type 2 diabetes, one or both of these problems may be present:  The pancreas does not make enough of a hormone called insulin.  Cells in the body do not respond properly to insulin that the body makes (insulin resistance). Normally, insulin allows blood sugar (glucose) to enter cells in the body. The cells use glucose for energy. Insulin resistance or lack of insulin causes excess glucose to build up in the blood instead of going into cells. As a result, high blood glucose (hyperglycemia) develops. What increases the risk? The following factors may make you more likely to develop type 2 diabetes:  Having a family member with type 2 diabetes.  Being overweight or obese.  Having an inactive (sedentary) lifestyle.  Having been diagnosed with insulin resistance.  Having a history of prediabetes, gestational diabetes, or polycystic ovary syndrome (PCOS).  Being of American-Indian, African-American, Hispanic/Latino, or Asian/Pacific Islander descent. What are the signs or symptoms? In the early stage of this condition, you may not have symptoms. Symptoms develop slowly and may include:  Increased thirst (polydipsia).  Increased hunger(polyphagia).  Increased urination (polyuria).  Increased urination during the night (nocturia).  Unexplained weight loss.  Frequent infections that keep coming back (recurring).  Fatigue.  Weakness.  Vision changes, such as blurry vision.  Cuts or bruises that are slow to heal.  Tingling or numbness in the hands or feet.  Dark patches on the skin (acanthosis nigricans). How is this diagnosed? This condition is diagnosed based on your symptoms, your medical history, a physical exam, and your blood glucose level. Your blood glucose may be checked with one or more of the following blood tests:  A fasting blood glucose (FBG)  test. You will not be allowed to eat (you will fast) for 8 hours or longer before a blood sample is taken.  A random blood glucose test. This test checks blood glucose at any time of day regardless of when you ate.  An A1c (hemoglobin A1c) blood test. This test provides information about blood glucose control over the previous 2-3 months.  An oral glucose tolerance test (OGTT). This test measures your blood glucose at two times: ? After fasting. This is your baseline blood glucose level. ? Two hours after drinking a beverage that contains glucose. You may be diagnosed with type 2 diabetes if:  Your FBG level is 126 mg/dL (7.0 mmol/L) or higher.  Your random blood glucose level is 200 mg/dL (11.1 mmol/L) or higher.  Your A1c level is 6.5% or higher.  Your OGTT result is higher than 200 mg/dL (11.1 mmol/L). These blood tests may be repeated to confirm your diagnosis. How is this treated? Your treatment may be managed by a specialist called an endocrinologist. Type 2 diabetes may be treated by following instructions from your health care provider about:  Making diet and lifestyle changes. This may include: ? Following an individualized nutrition plan that is developed by a diet and nutrition specialist (registered dietitian). ? Exercising regularly. ? Finding ways to manage stress.  Checking your blood glucose level as often as told.  Taking diabetes medicines or insulin daily. This helps to keep your blood glucose levels in the healthy range. ? If you use insulin, you may need to adjust the dosage depending on how physically active you are and what foods you eat. Your health care provider will tell you how to adjust your dosage.    Taking medicines to help prevent complications from diabetes, such as: ? Aspirin. ? Medicine to lower cholesterol. ? Medicine to control blood pressure. Your health care provider will set individualized treatment goals for you. Your goals will be based on  your age, other medical conditions you have, and how you respond to diabetes treatment. Generally, the goal of treatment is to maintain the following blood glucose levels:  Before meals (preprandial): 80-130 mg/dL (4.4-7.2 mmol/L).  After meals (postprandial): below 180 mg/dL (10 mmol/L).  A1c level: less than 7%. Follow these instructions at home: Questions to ask your health care provider  Consider asking the following questions: ? Do I need to meet with a diabetes educator? ? Where can I find a support group for people with diabetes? ? What equipment will I need to manage my diabetes at home? ? What diabetes medicines do I need, and when should I take them? ? How often do I need to check my blood glucose? ? What number can I call if I have questions? ? When is my next appointment? General instructions  Take over-the-counter and prescription medicines only as told by your health care provider.  Keep all follow-up visits as told by your health care provider. This is important.  For more information about diabetes, visit: ? American Diabetes Association (ADA): www.diabetes.org ? American Association of Diabetes Educators (AADE): www.diabeteseducator.org Contact a health care provider if:  Your blood glucose is at or above 240 mg/dL (13.3 mmol/L) for 2 days in a row.  You have been sick or have had a fever for 2 days or longer, and you are not getting better.  You have any of the following problems for more than 6 hours: ? You cannot eat or drink. ? You have nausea and vomiting. ? You have diarrhea. Get help right away if:  Your blood glucose is lower than 54 mg/dL (3.0 mmol/L).  You become confused or you have trouble thinking clearly.  You have difficulty breathing.  You have moderate or large ketone levels in your urine. Summary  Type 2 diabetes (type 2 diabetes mellitus) is a long-term (chronic) disease. In type 2 diabetes, the pancreas does not make enough of a  hormone called insulin, or cells in the body do not respond properly to insulin that the body makes (insulin resistance).  This condition is treated by making diet and lifestyle changes and taking diabetes medicines or insulin.  Your health care provider will set individualized treatment goals for you. Your goals will be based on your age, other medical conditions you have, and how you respond to diabetes treatment.  Keep all follow-up visits as told by your health care provider. This is important. This information is not intended to replace advice given to you by your health care provider. Make sure you discuss any questions you have with your health care provider. Document Released: 09/11/2005 Document Revised: 04/12/2017 Document Reviewed: 10/15/2015 Elsevier Interactive Patient Education  2019 [employer]  

## 2018-12-12 MED ORDER — OLMESARTAN MEDOXOMIL-HCTZ 40-12.5 MG PO TABS: 1.0000 | ORAL_TABLET | Freq: Every day | ORAL | 1 refills | Status: DC

## 2018-12-16 ENCOUNTER — Ambulatory Visit: Payer: BLUE CROSS/BLUE SHIELD

## 2018-12-18 ENCOUNTER — Ambulatory Visit: Payer: BLUE CROSS/BLUE SHIELD | Admitting: Internal Medicine

## 2020-05-04 ENCOUNTER — Ambulatory Visit
Admission: EM | Admit: 2020-05-04 | Discharge: 2020-05-04 | Disposition: A | Discharge status: Home / Self Care | Payer: BC Managed Care – PPO | Attending: Family Medicine | Admitting: Family Medicine

## 2020-05-04 ENCOUNTER — Other Ambulatory Visit: Payer: Self-pay

## 2020-05-04 ENCOUNTER — Encounter: Payer: Self-pay | Admitting: Emergency Medicine

## 2020-05-04 DIAGNOSIS — S39012A Strain of muscle, fascia and tendon of lower back, initial encounter: Secondary | ICD-10-CM

## 2020-05-04 MED ORDER — CYCLOBENZAPRINE HCL 10 MG PO TABS: 10.0000 mg | ORAL_TABLET | Freq: Two times a day (BID) | ORAL | 0 refills | Status: DC | PRN
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
## 2020-06-29 ENCOUNTER — Encounter: Payer: Self-pay | Admitting: Internal Medicine

## 2020-06-29 ENCOUNTER — Ambulatory Visit (INDEPENDENT_AMBULATORY_CARE_PROVIDER_SITE_OTHER): Payer: BC Managed Care – PPO | Admitting: Internal Medicine

## 2020-06-29 ENCOUNTER — Other Ambulatory Visit: Payer: Self-pay

## 2020-06-29 VITALS — BP 140/102 | HR 85 | Temp 98.5°F | Resp 16 | Ht 69.0 in | Wt 233.0 lb

## 2020-06-29 DIAGNOSIS — Z794 Long term (current) use of insulin: Secondary | ICD-10-CM

## 2020-06-29 DIAGNOSIS — E118 Type 2 diabetes mellitus with unspecified complications: Secondary | ICD-10-CM

## 2020-06-29 DIAGNOSIS — E785 Hyperlipidemia, unspecified: Secondary | ICD-10-CM | POA: Diagnosis not present

## 2020-06-29 DIAGNOSIS — Z23 Encounter for immunization: Secondary | ICD-10-CM | POA: Diagnosis not present

## 2020-06-29 DIAGNOSIS — I10 Essential (primary) hypertension: Secondary | ICD-10-CM

## 2020-06-29 DIAGNOSIS — N478 Other disorders of prepuce: Secondary | ICD-10-CM

## 2020-06-29 DIAGNOSIS — R0683 Snoring: Secondary | ICD-10-CM

## 2020-06-29 DIAGNOSIS — Z1159 Encounter for screening for other viral diseases: Secondary | ICD-10-CM

## 2020-06-29 DIAGNOSIS — Z0000 Encounter for general adult medical examination without abnormal findings: Secondary | ICD-10-CM | POA: Diagnosis not present

## 2020-06-29 DIAGNOSIS — E559 Vitamin D deficiency, unspecified: Secondary | ICD-10-CM

## 2020-06-29 DIAGNOSIS — K219 Gastro-esophageal reflux disease without esophagitis: Secondary | ICD-10-CM

## 2020-06-29 NOTE — Progress Notes (Signed)
Subjective:  Patient ID: Lee Hunt, male    DOB: [birth year]  Age: 44 y.o. MRN: [id number]  CC: Annual Exam, Hyperlipidemia, Diabetes, and Hypertension  This visit occurred during the SARS-CoV-2 public health emergency.  Safety protocols were in place, including screening questions prior to the visit, additional usage of staff PPE, and extensive cleaning of exam room while observing appropriate contact time as indicated for disinfecting solutions.    HPI Lee Hunt presents for a CPX.  He has not been his hypertension and diabetes.  He has had a few headaches recently but he denies blurred vision, chest pain, shortness of breath, palpitations, edema, or fatigue.  He is active and denies diaphoresis.  He tells me his his blood sugars have been high and he complains of polys.  Outpatient Medications Prior to Visit  Medication Sig Dispense Refill  . cyclobenzaprine (FLEXERIL) 10 MG tablet Take 1 tablet (10 mg total) by mouth 2 (two) times daily as needed for muscle spasms. 20 tablet 0  . Blood Glucose Monitoring Suppl (ACCU-CHEK GUIDE) w/Device KIT 1 Act by Does not apply route 3 (three) times daily with meals. 2 kit 0  . Cholecalciferol 50000 units capsule Take 1 capsule (50,000 Units total) by mouth once a week. 12 capsule 1  . glucose blood (ACCU-CHEK GUIDE) test strip Use TID 100 each 11  . metFORMIN (GLUCOPHAGE) 1000 MG tablet Take 1 tablet (1,000 mg total) by mouth 2 (two) times daily with a meal. 180 tablet 1  . olmesartan-hydrochlorothiazide (BENICAR HCT) 40-12.5 MG tablet Take 1 tablet by mouth daily. 90 tablet 1  . rosuvastatin (CRESTOR) 5 MG tablet Take 1 tablet (5 mg total) by mouth daily. 90 tablet 1   No facility-administered medications prior to visit.    ROS Review of Systems  Constitutional: Negative.  Negative for appetite change, diaphoresis, fatigue and unexpected weight change.  HENT: Negative.   Eyes: Negative for visual disturbance.    Respiratory: Negative for cough, chest tightness, shortness of breath and wheezing.   Cardiovascular: Negative for chest pain, palpitations and leg swelling.  Gastrointestinal: Negative for abdominal pain, blood in stool, constipation, diarrhea, nausea and vomiting.  Endocrine: Positive for polydipsia, polyphagia and polyuria. Negative for cold intolerance and heat intolerance.  Genitourinary: Negative.  Negative for difficulty urinating, discharge, dysuria, penile swelling, scrotal swelling and testicular pain.       He remains concerned about his foreskin.  Musculoskeletal: Negative for arthralgias and myalgias.  Skin: Negative.   Neurological: Positive for headaches. Negative for dizziness, weakness and light-headedness.  Hematological: Negative for adenopathy. Does not bruise/bleed easily.  Psychiatric/Behavioral: Negative.     Objective:  BP (!) 140/102   Pulse 85   Temp 98.5 F (36.9 C) (Oral)   Resp 16   Ht $R'5\' 9"'Ik$  (1.753 m)   Wt 233 lb (105.7 kg)   SpO2 95%   BMI 34.41 kg/m   BP Readings from Last 3 Encounters:  06/29/20 (!) 140/102  05/04/20 (!) 150/94  12/11/18 (!) 144/92    Wt Readings from Last 3 Encounters:  06/29/20 233 lb (105.7 kg)  12/11/18 230 lb (104.3 kg)  03/26/18 229 lb 8 oz (104.1 kg)    Physical Exam Vitals reviewed.  Constitutional:      Appearance: Normal appearance.  HENT:     Nose: Nose normal.     Mouth/Throat:     Mouth: Mucous membranes are moist.  Eyes:     General: No scleral icterus.  Conjunctiva/sclera: Conjunctivae normal.  Cardiovascular:     Rate and Rhythm: Normal rate and regular rhythm.     Heart sounds: No murmur heard.      Comments: EKG - NSR, 73 bpm Normal EKG Pulmonary:     Effort: Pulmonary effort is normal.     Breath sounds: No stridor. No wheezing, rhonchi or rales.  Abdominal:     General: Abdomen is flat. Bowel sounds are normal. There is no distension.     Palpations: There is splenomegaly. There is no  hepatomegaly or mass.     Tenderness: There is no abdominal tenderness.     Hernia: There is no hernia in the right inguinal area.  Genitourinary:    Pubic Area: No rash.      Penis: Normal and circumcised. No discharge, swelling or lesions.      Testes: Normal.        Right: Mass, tenderness or swelling not present.        Left: Mass, tenderness or swelling not present.     Epididymis:     Right: Normal. Not inflamed or enlarged.     Left: Normal. Not inflamed or enlarged.     Prostate: Normal. Not enlarged, not tender and no nodules present.     Rectum: Normal. Guaiac result negative. No mass, tenderness, anal fissure, external hemorrhoid or internal hemorrhoid. Normal anal tone.     Comments: Foreskin is redundant but otherwise uncomplicated. Musculoskeletal:        General: Normal range of motion.     Cervical back: Neck supple.     Right lower leg: No edema.     Left lower leg: No edema.  Lymphadenopathy:     Cervical: No cervical adenopathy.     Lower Body: No right inguinal adenopathy. No left inguinal adenopathy.  Skin:    General: Skin is warm and dry.     Coloration: Skin is not pale.     Findings: No rash.  Neurological:     General: No focal deficit present.     Mental Status: He is alert and oriented to person, place, and time. Mental status is at baseline.  Psychiatric:        Mood and Affect: Mood normal.        Behavior: Behavior normal.     Lab Results  Component Value Date   WBC 8.5 06/30/2020   HGB 14.8 06/30/2020   HCT 42.2 06/30/2020   PLT 238.0 06/30/2020   GLUCOSE 185 (H) 06/30/2020   CHOL 156 06/30/2020   TRIG 224.0 (H) 06/30/2020   HDL 34.20 (L) 06/30/2020   LDLDIRECT 90.0 06/30/2020   LDLCALC 98 03/26/2018   ALT 29 06/30/2020   AST 17 06/30/2020   NA 134 (L) 06/30/2020   K 4.2 06/30/2020   CL 100 06/30/2020   CREATININE 0.90 06/30/2020   BUN 9 06/30/2020   CO2 26 06/30/2020   TSH 1.68 06/30/2020   PSA 0.30 06/30/2020   HGBA1C 8.5 (H)  06/30/2020   MICROALBUR <0.7 06/30/2020    No results found.  Assessment & Plan:   Lee Hunt was seen today for annual exam, hyperlipidemia, diabetes and hypertension.  Diagnoses and all orders for this visit:  Essential hypertension- His blood pressure is not adequately well controlled and he is symptomatic.  His labs are negative for secondary causes or endorgan damage.  His EKG is reassuring.  I recommended that he restart the ARB and thiazide diuretic. -     CBC  with Differential/Platelet; Future -     TSH; Future -     Urinalysis, Routine w reflex microscopic; Future -     EKG 12-Lead -     olmesartan-hydrochlorothiazide (BENICAR HCT) 40-12.5 MG tablet; Take 1 tablet by mouth daily.  Gastroesophageal reflux disease without esophagitis- His symptoms are well controlled. -     CBC with Differential/Platelet; Future  Type II diabetes mellitus with manifestations ([place])- His A1c is up to 8.5% and he has symptoms of glucose toxicity.  I recommend that he restart Metformin and to start using a basal insulin and GLP-1 agonist. -     BASIC METABOLIC PANEL WITH GFR; Future -     Hemoglobin A1c; Future -     Microalbumin / creatinine urine ratio; Future -     Cancel: Ambulatory referral to Ophthalmology -     Ambulatory referral to Ophthalmology -     HM Diabetes Foot Exam -     metFORMIN (GLUCOPHAGE) 1000 MG tablet; Take 1 tablet (1,000 mg total) by mouth 2 (two) times daily with a meal. -     Insulin Glargine-Lixisenatide (SOLIQUA) 100-33 UNT-MCG/ML SOPN; Inject 20 Units into the skin daily. -     Insulin Pen Needle 32G X 6 MM MISC; 1 Act by Does not apply route daily as needed. -     Continuous Blood Gluc Receiver (FREESTYLE LIBRE 14 DAY READER) DEVI; 1 Act by Does not apply route daily. -     Continuous Blood Gluc Sensor (FREESTYLE LIBRE 14 DAY SENSOR) MISC; 1 Act by Does not apply route daily. -     olmesartan-hydrochlorothiazide (BENICAR HCT) 40-12.5 MG tablet; Take 1 tablet by mouth  daily. -     Amb Referral to Nutrition and Diabetic E  Dyslipidemia, goal LDL below 70- I have asked him to take a statin for cardiovascular risk reduction. -     TSH; Future -     Hepatic function panel; Future -     rosuvastatin (CRESTOR) 5 MG tablet; Take 1 tablet (5 mg total) by mouth daily.  Routine general medical examination at a health care facility- Exam completed, labs reviewed, vaccines reviewed and updated, cancer screenings are up-to-date, patient education was given. -     Lipid panel; Future -     PSA; Future  Vitamin D deficiency -     VITAMIN D 25 Hydroxy (Vit-D Deficiency, Fractures); Future -     Cholecalciferol 1.25 MG (50000 UT) capsule; Take 1 capsule (50,000 Units total) by mouth once a week.  Snoring- I recommended that he had this evaluated and treated by sleep medicine.  Need for hepatitis C screening test -     Hepatitis C antibody; Future  Redundant foreskin -     Ambulatory referral to Urology  Type 2 diabetes mellitus with complication, with long-term current use of insulin (HCC) -     metFORMIN (GLUCOPHAGE) 1000 MG tablet; Take 1 tablet (1,000 mg total) by mouth 2 (two) times daily with a meal. -     Insulin Glargine-Lixisenatide (SOLIQUA) 100-33 UNT-MCG/ML SOPN; Inject 20 Units into the skin daily. -     Insulin Pen Needle 32G X 6 MM MISC; 1 Act by Does not apply route daily as needed. -     Continuous Blood Gluc Receiver (FREESTYLE LIBRE 14 DAY READER) DEVI; 1 Act by Does not apply route daily. -     Continuous Blood Gluc Sensor (FREESTYLE LIBRE 14 DAY SENSOR) MISC; 1 Act by Does  not apply route daily. -     Amb Referral to Nutrition and Diabetic E  Type 2 diabetes mellitus with complication, without long-term current use of insulin (HCC) -     olmesartan-hydrochlorothiazide (BENICAR HCT) 40-12.5 MG tablet; Take 1 tablet by mouth daily. -     Amb Referral to Nutrition and Diabetic E  Other orders -     Pneumococcal conjugate vaccine 13-valent -      Flu Vaccine QUAD 6+ mos PF IM (Fluarix Quad PF)   I have discontinued Cherre Blanc. Bezio's glucose blood and Accu-Chek Guide. I have also changed his Cholecalciferol. Additionally, I am having him start on Soliqua, Insulin Pen Needle, FreeStyle Libre 14 Day Reader, and [employer] 14 Day Sensor. Lastly, I am having him maintain his cyclobenzaprine, rosuvastatin, metFORMIN, and olmesartan-hydrochlorothiazide.  Meds ordered this encounter  Medications  . rosuvastatin (CRESTOR) 5 MG tablet    Sig: Take 1 tablet (5 mg total) by mouth daily.    Dispense:  90 tablet    Refill:  1  . metFORMIN (GLUCOPHAGE) 1000 MG tablet    Sig: Take 1 tablet (1,000 mg total) by mouth 2 (two) times daily with a meal.    Dispense:  180 tablet    Refill:  1  . Insulin Glargine-Lixisenatide (SOLIQUA) 100-33 UNT-MCG/ML SOPN    Sig: Inject 20 Units into the skin daily.    Dispense:  9 mL    Refill:  1  . Insulin Pen Needle 32G X 6 MM MISC    Sig: 1 Act by Does not apply route daily as needed.    Dispense:  100 each    Refill:  1  . Continuous Blood Gluc Receiver (FREESTYLE LIBRE 14 DAY READER) DEVI    Sig: 1 Act by Does not apply route daily.    Dispense:  2 each    Refill:  5  . Continuous Blood Gluc Sensor (FREESTYLE LIBRE 14 DAY SENSOR) MISC    Sig: 1 Act by Does not apply route daily.    Dispense:  2 each    Refill:  5  . olmesartan-hydrochlorothiazide (BENICAR HCT) 40-12.5 MG tablet    Sig: Take 1 tablet by mouth daily.    Dispense:  90 tablet    Refill:  1  . Cholecalciferol 1.25 MG (50000 UT) capsule    Sig: Take 1 capsule (50,000 Units total) by mouth once a week.    Dispense:  12 capsule    Refill:  1   In addition to time spent on CPE, I spent 50 minutes in preparing to see the patient by review of recent labs, imaging and procedures, obtaining and reviewing separately obtained history, communicating with the patient and family or caregiver, ordering medications, tests or procedures, and  documenting clinical information in the EHR including the differential Dx, treatment, and any further evaluation and other management of 1. Essential hypertension 2. Gastroesophageal reflux disease without esophagitis 3. Type II diabetes mellitus with manifestations ([place]) 4. Dyslipidemia, goal LDL below 70 5. Vitamin D deficiency 6. Snoring 7. Redundant foreskin 8. Type 2 diabetes mellitus with complication, with long-term current use of insulin ([place]) 9. Type 2 diabetes mellitus with complication, without long-term current use of insulin ([place])      Follow-up: Return in about 4 months (around 10/30/2020).  Scarlette Calico, MD

## 2020-06-29 NOTE — Patient Instructions (Signed)

## 2020-06-30 ENCOUNTER — Other Ambulatory Visit (INDEPENDENT_AMBULATORY_CARE_PROVIDER_SITE_OTHER): Payer: BC Managed Care – PPO

## 2020-06-30 DIAGNOSIS — I10 Essential (primary) hypertension: Secondary | ICD-10-CM

## 2020-06-30 DIAGNOSIS — E785 Hyperlipidemia, unspecified: Secondary | ICD-10-CM

## 2020-06-30 DIAGNOSIS — K219 Gastro-esophageal reflux disease without esophagitis: Secondary | ICD-10-CM

## 2020-06-30 DIAGNOSIS — E559 Vitamin D deficiency, unspecified: Secondary | ICD-10-CM

## 2020-06-30 DIAGNOSIS — E118 Type 2 diabetes mellitus with unspecified complications: Secondary | ICD-10-CM | POA: Diagnosis not present

## 2020-06-30 DIAGNOSIS — Z1159 Encounter for screening for other viral diseases: Secondary | ICD-10-CM

## 2020-06-30 DIAGNOSIS — Z0000 Encounter for general adult medical examination without abnormal findings: Secondary | ICD-10-CM

## 2020-06-30 LAB — CBC WITH DIFFERENTIAL/PLATELET
Basophils Absolute: 0 10*3/uL (ref 0.0–0.1)
Basophils Relative: 0.4 % (ref 0.0–3.0)
Eosinophils Absolute: 0.1 10*3/uL (ref 0.0–0.7)
Eosinophils Relative: 0.8 % (ref 0.0–5.0)
HCT: 42.2 % (ref 39.0–52.0)
Hemoglobin: 14.8 g/dL (ref 13.0–17.0)
Lymphocytes Relative: 11 % — ABNORMAL LOW (ref 12.0–46.0)
Lymphs Abs: 0.9 10*3/uL (ref 0.7–4.0)
MCHC: 35 g/dL (ref 30.0–36.0)
MCV: 85.6 fl (ref 78.0–100.0)
Monocytes Absolute: 0.6 10*3/uL (ref 0.1–1.0)
Monocytes Relative: 6.6 % (ref 3.0–12.0)
Neutro Abs: 6.9 10*3/uL (ref 1.4–7.7)
Neutrophils Relative %: 81.2 % — ABNORMAL HIGH (ref 43.0–77.0)
Platelets: 238 10*3/uL (ref 150.0–400.0)
RBC: 4.93 Mil/uL (ref 4.22–5.81)
RDW: 13.3 % (ref 11.5–15.5)
WBC: 8.5 10*3/uL (ref 4.0–10.5)

## 2020-06-30 LAB — MICROALBUMIN / CREATININE URINE RATIO
Creatinine,U: 85.4 mg/dL
Microalb Creat Ratio: 0.8 mg/g (ref 0.0–30.0)
Microalb, Ur: <0.7 mg/dL (ref 0.0–1.9)

## 2020-06-30 LAB — URINALYSIS, ROUTINE W REFLEX MICROSCOPIC
Bilirubin Urine: NEGATIVE
Hgb urine dipstick: NEGATIVE
Ketones, ur: NEGATIVE
Leukocytes,Ua: NEGATIVE
Nitrite: NEGATIVE
RBC / HPF: NONE SEEN (ref 0–?)
Specific Gravity, Urine: 1.015 (ref 1.000–1.030)
Total Protein, Urine: NEGATIVE
Urine Glucose: >=1000 — AB
Urobilinogen, UA: 0.2 (ref 0.0–1.0)
pH: 6.5 (ref 5.0–8.0)

## 2020-06-30 LAB — LIPID PANEL
Cholesterol: 156 mg/dL (ref 0–200)
HDL: 34.2 mg/dL — ABNORMAL LOW (ref >39.00)
NonHDL: 121.95
Total CHOL/HDL Ratio: 5
Triglycerides: 224 mg/dL — ABNORMAL HIGH (ref 0.0–149.0)
VLDL: 44.8 mg/dL — ABNORMAL HIGH (ref 0.0–40.0)

## 2020-06-30 LAB — BASIC METABOLIC PANEL
BUN: 9 mg/dL (ref 6–23)
CO2: 26 mEq/L (ref 19–32)
Calcium: 8.9 mg/dL (ref 8.4–10.5)
Chloride: 100 mEq/L (ref 96–112)
Creatinine, Ser: 0.9 mg/dL (ref 0.40–1.50)
GFR: 103.66 mL/min (ref >60.00)
Glucose, Bld: 185 mg/dL — ABNORMAL HIGH (ref 70–99)
Potassium: 4.2 mEq/L (ref 3.5–5.1)
Sodium: 134 mEq/L — ABNORMAL LOW (ref 135–145)

## 2020-06-30 LAB — LDL CHOLESTEROL, DIRECT: Direct LDL: 90 mg/dL

## 2020-06-30 LAB — HEPATIC FUNCTION PANEL
ALT: 29 U/L (ref 0–53)
AST: 17 U/L (ref 0–37)
Albumin: 4.1 g/dL (ref 3.5–5.2)
Alkaline Phosphatase: 62 U/L (ref 39–117)
Bilirubin, Direct: 0.1 mg/dL (ref 0.0–0.3)
Total Bilirubin: 0.7 mg/dL (ref 0.2–1.2)
Total Protein: 6.6 g/dL (ref 6.0–8.3)

## 2020-06-30 LAB — TSH: TSH: 1.68 u[IU]/mL (ref 0.35–4.50)

## 2020-06-30 LAB — PSA: PSA: 0.3 ng/mL (ref 0.10–4.00)

## 2020-06-30 LAB — HEMOGLOBIN A1C: Hgb A1c MFr Bld: 8.5 % — ABNORMAL HIGH (ref 4.6–6.5)

## 2020-06-30 LAB — VITAMIN D 25 HYDROXY (VIT D DEFICIENCY, FRACTURES): VITD: 14.44 ng/mL — ABNORMAL LOW (ref 30.00–100.00)

## 2020-06-30 MED ORDER — SOLIQUA 100-33 UNT-MCG/ML ~~LOC~~ SOPN: 20.0000 [IU] | PEN_INJECTOR | Freq: Every day | SUBCUTANEOUS | 1 refills | Status: DC

## 2020-06-30 MED ORDER — CHOLECALCIFEROL 1.25 MG (50000 UT) PO CAPS: 50000.0000 [IU] | ORAL_CAPSULE | ORAL | 1 refills | Status: AC

## 2020-06-30 MED ORDER — OLMESARTAN MEDOXOMIL-HCTZ 40-12.5 MG PO TABS: 1.0000 | ORAL_TABLET | Freq: Every day | ORAL | 1 refills | Status: DC

## 2020-06-30 MED ORDER — INSULIN PEN NEEDLE 32G X 6 MM MISC: 1.0000 | Freq: Every day | 1 refills | Status: DC | PRN

## 2020-06-30 MED ORDER — FREESTYLE LIBRE 14 DAY READER DEVI: 1.0000 | Freq: Every day | 5 refills | Status: DC

## 2020-06-30 MED ORDER — FREESTYLE LIBRE 14 DAY SENSOR MISC: 1.0000 | Freq: Every day | 5 refills | Status: DC

## 2020-06-30 MED ORDER — ROSUVASTATIN CALCIUM 5 MG PO TABS: 5.0000 mg | ORAL_TABLET | Freq: Every day | ORAL | 1 refills | Status: DC

## 2020-06-30 MED ORDER — METFORMIN HCL 1000 MG PO TABS: 1000.0000 mg | ORAL_TABLET | Freq: Two times a day (BID) | ORAL | 1 refills | Status: DC

## 2020-07-01 LAB — HEPATITIS C ANTIBODY
Hepatitis C Ab: NONREACTIVE
SIGNAL TO CUT-OFF: 0.1 (ref <1.00)

## 2020-08-04 ENCOUNTER — Institutional Professional Consult (permissible substitution): Payer: Self-pay | Admitting: Neurology

## 2020-08-30 ENCOUNTER — Encounter: Payer: Self-pay | Admitting: Neurology

## 2020-08-30 ENCOUNTER — Telehealth: Payer: Self-pay | Admitting: Neurology

## 2020-08-30 ENCOUNTER — Institutional Professional Consult (permissible substitution): Payer: Self-pay | Admitting: Neurology

## 2020-08-30 NOTE — Telephone Encounter (Signed)
Pt was no show to the new pt/sleep consult apt today

## 2020-12-15 LAB — HM DIABETES EYE EXAM

## 2021-03-16 ENCOUNTER — Telehealth: Payer: Self-pay | Admitting: Internal Medicine

## 2021-03-16 NOTE — Telephone Encounter (Signed)
    Patient is wondering if Dr. Yetta Barre could write a prescription for FreeStyle Libre 2. It can be sent to [facility] [address] [place], [place] - [address]   Last OV: 06-29-20 Next OV : 04-21-21

## 2021-03-17 ENCOUNTER — Other Ambulatory Visit: Payer: Self-pay | Admitting: Internal Medicine

## 2021-03-17 DIAGNOSIS — E118 Type 2 diabetes mellitus with unspecified complications: Secondary | ICD-10-CM

## 2021-03-17 MED ORDER — FREESTYLE LIBRE 2 READER DEVI: 1.0000 | Freq: Every day | 5 refills | Status: AC

## 2021-03-17 MED ORDER — FREESTYLE LIBRE 2 SENSOR MISC: 1.0000 | Freq: Every day | 5 refills | Status: AC

## 2021-04-21 ENCOUNTER — Ambulatory Visit (INDEPENDENT_AMBULATORY_CARE_PROVIDER_SITE_OTHER): Payer: Self-pay | Admitting: Internal Medicine

## 2021-04-21 ENCOUNTER — Other Ambulatory Visit: Payer: Self-pay

## 2021-04-21 ENCOUNTER — Encounter: Payer: Self-pay | Admitting: Internal Medicine

## 2021-04-21 VITALS — BP 138/88 | HR 60 | Temp 98.5°F | Resp 16 | Ht 69.0 in | Wt 222.0 lb

## 2021-04-21 DIAGNOSIS — N5201 Erectile dysfunction due to arterial insufficiency: Secondary | ICD-10-CM | POA: Insufficient documentation

## 2021-04-21 DIAGNOSIS — E785 Hyperlipidemia, unspecified: Secondary | ICD-10-CM

## 2021-04-21 DIAGNOSIS — E118 Type 2 diabetes mellitus with unspecified complications: Secondary | ICD-10-CM

## 2021-04-21 DIAGNOSIS — I10 Essential (primary) hypertension: Secondary | ICD-10-CM

## 2021-04-21 LAB — LIPID PANEL
Cholesterol: 163 mg/dL (ref 0–200)
HDL: 33.7 mg/dL — ABNORMAL LOW (ref >39.00)
LDL Cholesterol: 104 mg/dL — ABNORMAL HIGH (ref 0–99)
NonHDL: 128.84
Total CHOL/HDL Ratio: 5
Triglycerides: 122 mg/dL (ref 0.0–149.0)
VLDL: 24.4 mg/dL (ref 0.0–40.0)

## 2021-04-21 LAB — CBC WITH DIFFERENTIAL/PLATELET
Basophils Absolute: 0 10*3/uL (ref 0.0–0.1)
Basophils Relative: 0.5 % (ref 0.0–3.0)
Eosinophils Absolute: 0.1 10*3/uL (ref 0.0–0.7)
Eosinophils Relative: 1.2 % (ref 0.0–5.0)
HCT: 45.6 % (ref 39.0–52.0)
Hemoglobin: 15.6 g/dL (ref 13.0–17.0)
Lymphocytes Relative: 29.1 % (ref 12.0–46.0)
Lymphs Abs: 1.4 10*3/uL (ref 0.7–4.0)
MCHC: 34.3 g/dL (ref 30.0–36.0)
MCV: 85.7 fl (ref 78.0–100.0)
Monocytes Absolute: 0.3 10*3/uL (ref 0.1–1.0)
Monocytes Relative: 6.3 % (ref 3.0–12.0)
Neutro Abs: 3.1 10*3/uL (ref 1.4–7.7)
Neutrophils Relative %: 62.9 % (ref 43.0–77.0)
Platelets: 210 10*3/uL (ref 150.0–400.0)
RBC: 5.32 Mil/uL (ref 4.22–5.81)
RDW: 13.7 % (ref 11.5–15.5)
WBC: 4.9 10*3/uL (ref 4.0–10.5)

## 2021-04-21 LAB — BASIC METABOLIC PANEL
BUN: 7 mg/dL (ref 6–23)
CO2: 29 mEq/L (ref 19–32)
Calcium: 9.7 mg/dL (ref 8.4–10.5)
Chloride: 99 mEq/L (ref 96–112)
Creatinine, Ser: 1.01 mg/dL (ref 0.40–1.50)
GFR: 90.39 mL/min (ref >60.00)
Glucose, Bld: 164 mg/dL — ABNORMAL HIGH (ref 70–99)
Potassium: 3.9 mEq/L (ref 3.5–5.1)
Sodium: 135 mEq/L (ref 135–145)

## 2021-04-21 LAB — HEMOGLOBIN A1C: Hgb A1c MFr Bld: 8.1 % — ABNORMAL HIGH (ref 4.6–6.5)

## 2021-04-21 MED ORDER — TADALAFIL 20 MG PO TABS: 20.0000 mg | ORAL_TABLET | ORAL | 4 refills | Status: AC

## 2021-04-21 MED ORDER — ROSUVASTATIN CALCIUM 5 MG PO TABS: 5.0000 mg | ORAL_TABLET | Freq: Every day | ORAL | 1 refills | Status: AC

## 2021-04-21 MED ORDER — SYNJARDY 5-1000 MG PO TABS: 1.0000 | ORAL_TABLET | Freq: Two times a day (BID) | ORAL | 0 refills | Status: AC

## 2021-04-21 NOTE — Patient Instructions (Signed)

## 2021-04-21 NOTE — Progress Notes (Signed)
Subjective:  Patient ID: Lee Hunt, male    DOB: Dec 27, 1975  Age: 45 y.o. MRN: [id number]  CC: Hypertension, Hyperlipidemia, and Diabetes  This visit occurred during the SARS-CoV-2 public health emergency.  Safety protocols were in place, including screening questions prior to the visit, additional usage of staff PPE, and extensive cleaning of exam room while observing appropriate contact time as indicated for disinfecting solutions.    HPI Keisuke Hollabaugh [place] presents for f/up -  He has not taking anything for hypertension or blood sugar control.  He is active and denies any recent episodes of headache, blurred vision, chest pain, shortness of breath, polys, diaphoresis, dizziness, or lightheadedness.  He complains of VED.  Outpatient Medications Prior to Visit  Medication Sig Dispense Refill   Continuous Blood Gluc Receiver (FREESTYLE LIBRE 2 READER) DEVI 1 Act by Does not apply route daily. 2 each 5   Continuous Blood Gluc Sensor (FREESTYLE LIBRE 2 SENSOR) MISC 1 Act by Does not apply route daily. 2 each 5   Cholecalciferol 1.25 MG (50000 UT) capsule Take 1 capsule (50,000 Units total) by mouth once a week. (Patient not taking: Reported on 04/21/2021) 12 capsule 1   cyclobenzaprine (FLEXERIL) 10 MG tablet Take 1 tablet (10 mg total) by mouth 2 (two) times daily as needed for muscle spasms. (Patient not taking: Reported on 04/21/2021) 20 tablet 0   Insulin Glargine-Lixisenatide (SOLIQUA) 100-33 UNT-MCG/ML SOPN Inject 20 Units into the skin daily. (Patient not taking: Reported on 04/21/2021) 9 mL 1   Insulin Pen Needle 32G X 6 MM MISC 1 Act by Does not apply route daily as needed. (Patient not taking: Reported on 04/21/2021) 100 each 1   metFORMIN (GLUCOPHAGE) 1000 MG tablet Take 1 tablet (1,000 mg total) by mouth 2 (two) times daily with a meal. (Patient not taking: Reported on 04/21/2021) 180 tablet 1   olmesartan-hydrochlorothiazide (BENICAR HCT) 40-12.5 MG tablet Take 1 tablet  by mouth daily. (Patient not taking: Reported on 04/21/2021) 90 tablet 1   rosuvastatin (CRESTOR) 5 MG tablet Take 1 tablet (5 mg total) by mouth daily. (Patient not taking: Reported on 04/21/2021) 90 tablet 1   No facility-administered medications prior to visit.    ROS Review of Systems  Constitutional:  Negative for diaphoresis, fatigue and unexpected weight change.  HENT: Negative.    Eyes:  Negative for visual disturbance.  Respiratory:  Negative for cough, chest tightness, shortness of breath and wheezing.   Cardiovascular:  Negative for chest pain, palpitations and leg swelling.  Gastrointestinal:  Negative for abdominal pain, constipation, diarrhea and nausea.  Endocrine: Negative.  Negative for polydipsia, polyphagia and polyuria.  Genitourinary: Negative.  Negative for difficulty urinating and dysuria.  Musculoskeletal:  Negative for arthralgias, joint swelling and myalgias.  Skin: Negative.  Negative for color change.  Neurological:  Negative for dizziness, weakness, light-headedness and headaches.  Hematological:  Negative for adenopathy. Does not bruise/bleed easily.  Psychiatric/Behavioral: Negative.     Objective:  BP 138/88 (BP Location: Right Arm, Patient Position: Sitting, Cuff Size: Large)   Pulse 60   Temp 98.5 F (36.9 C) (Oral)   Resp 16   Ht 5\' 9"  (1.753 m)   Wt 222 lb (100.7 kg)   SpO2 99%   BMI 32.78 kg/m   BP Readings from Last 3 Encounters:  04/21/21 138/88  06/29/20 (!) 140/102  05/04/20 (!) 150/94    Wt Readings from Last 3 Encounters:  04/21/21 222 lb (100.7 kg)  06/29/20 233 lb (  105.7 kg)  12/11/18 230 lb (104.3 kg)    Physical Exam Vitals reviewed.  HENT:     Nose: Nose normal.     Mouth/Throat:     Mouth: Mucous membranes are moist.  Eyes:     Conjunctiva/sclera: Conjunctivae normal.  Cardiovascular:     Rate and Rhythm: Normal rate and regular rhythm.     Heart sounds: No murmur heard. Pulmonary:     Effort: Pulmonary effort is  normal.     Breath sounds: No stridor. No wheezing, rhonchi or rales.  Abdominal:     General: Abdomen is protuberant. Bowel sounds are normal. There is no distension.     Palpations: Abdomen is soft. There is no hepatomegaly or splenomegaly.     Tenderness: There is no abdominal tenderness.  Musculoskeletal:        General: Normal range of motion.     Cervical back: Neck supple.     Right lower leg: No edema.     Left lower leg: No edema.  Lymphadenopathy:     Cervical: No cervical adenopathy.  Skin:    General: Skin is warm and dry.     Coloration: Skin is not pale.  Neurological:     General: No focal deficit present.     Mental Status: He is alert.  Psychiatric:        Mood and Affect: Mood normal.        Behavior: Behavior normal.    Lab Results  Component Value Date   WBC 4.9 04/21/2021   HGB 15.6 04/21/2021   HCT 45.6 04/21/2021   PLT 210.0 04/21/2021   GLUCOSE 164 (H) 04/21/2021   CHOL 163 04/21/2021   TRIG 122.0 04/21/2021   HDL 33.70 (L) 04/21/2021   LDLDIRECT 90.0 06/30/2020   LDLCALC 104 (H) 04/21/2021   ALT 29 06/30/2020   AST 17 06/30/2020   NA 135 04/21/2021   K 3.9 04/21/2021   CL 99 04/21/2021   CREATININE 1.01 04/21/2021   BUN 7 04/21/2021   CO2 29 04/21/2021   TSH 1.68 06/30/2020   PSA 0.30 06/30/2020   HGBA1C 8.1 (H) 04/21/2021   MICROALBUR <0.7 06/30/2020    No results found.  Assessment & Plan:   Kass was seen today for hypertension, hyperlipidemia and diabetes.  Diagnoses and all orders for this visit:  Essential hypertension- He has not achieved his blood pressure goal of 130/80.  He prefers not to take an antihypertensive.  I encouraged him to improve his lifestyle modifications. -     CBC with Differential/Platelet; Future -     CBC with Differential/Platelet  Type II diabetes mellitus with manifestations (HCC)- His A1c is at 8.1%.  I recommended that he start taking an SGLT2 inhibitor and metformin. -     Basic metabolic  panel; Future -     Hemoglobin A1c; Future -     Hemoglobin A1c -     Basic metabolic panel -     Empagliflozin-metFORMIN HCl (SYNJARDY) 01-999 MG TABS; Take 1 tablet by mouth 2 (two) times daily.  Dyslipidemia, goal LDL below 70- I have asked him to take a statin for cardiovascular risk reduction. -     rosuvastatin (CRESTOR) 5 MG tablet; Take 1 tablet (5 mg total) by mouth daily. -     Lipid panel; Future -     Lipid panel  Erectile dysfunction due to arterial insufficiency -     tadalafil (CIALIS) 20 MG tablet; Take 1 tablet (20  mg total) by mouth once a week.  I have discontinued [employer]. [place]'s cyclobenzaprine, metFORMIN, Soliqua, Insulin Pen Needle, and olmesartan-hydrochlorothiazide. I am also having him start on tadalafil and Synjardy. Additionally, I am having him maintain his Cholecalciferol, FreeStyle Libre 2 Sensor, [employer] 2 Reader, and rosuvastatin.  Meds ordered this encounter  Medications   rosuvastatin (CRESTOR) 5 MG tablet    Sig: Take 1 tablet (5 mg total) by mouth daily.    Dispense:  90 tablet    Refill:  1   tadalafil (CIALIS) 20 MG tablet    Sig: Take 1 tablet (20 mg total) by mouth once a week.    Dispense:  4 tablet    Refill:  4   Empagliflozin-metFORMIN HCl (SYNJARDY) 01-999 MG TABS    Sig: Take 1 tablet by mouth 2 (two) times daily.    Dispense:  180 tablet    Refill:  0     Follow-up: Return in about 3 months (around 07/22/2021).  Sanda Linger, MD
